# Patient Record
Sex: Female | Born: 1975 | Race: White | Hispanic: No | Marital: Single | Smoking: Never smoker
Health system: Southern US, Community
[De-identification: ages and names within clinical notes are randomized; demographics above are authoritative.]

## PROBLEM LIST (undated history)

## (undated) DIAGNOSIS — E785 Hyperlipidemia, unspecified: Secondary | ICD-10-CM

## (undated) DIAGNOSIS — F419 Anxiety disorder, unspecified: Secondary | ICD-10-CM

## (undated) DIAGNOSIS — K219 Gastro-esophageal reflux disease without esophagitis: Secondary | ICD-10-CM

## (undated) DIAGNOSIS — G4730 Sleep apnea, unspecified: Secondary | ICD-10-CM

## (undated) DIAGNOSIS — D869 Sarcoidosis, unspecified: Secondary | ICD-10-CM

## (undated) DIAGNOSIS — K760 Fatty (change of) liver, not elsewhere classified: Secondary | ICD-10-CM

## (undated) DIAGNOSIS — M1990 Unspecified osteoarthritis, unspecified site: Secondary | ICD-10-CM

## (undated) DIAGNOSIS — F32A Depression, unspecified: Secondary | ICD-10-CM

## (undated) DIAGNOSIS — I10 Essential (primary) hypertension: Secondary | ICD-10-CM

## (undated) HISTORY — DX: Unspecified osteoarthritis, unspecified site: M19.90

## (undated) HISTORY — DX: Sarcoidosis, unspecified: D86.9

## (undated) HISTORY — DX: Essential (primary) hypertension: I10

## (undated) HISTORY — PX: ANTERIOR CRUCIATE LIGAMENT REPAIR: SHX115

## (undated) HISTORY — PX: CHOLECYSTECTOMY: SHX55

## (undated) HISTORY — DX: Gastro-esophageal reflux disease without esophagitis: K21.9

## (undated) HISTORY — DX: Hyperlipidemia, unspecified: E78.5

## (undated) HISTORY — PX: BACK SURGERY: SHX140

## (undated) HISTORY — DX: Fatty (change of) liver, not elsewhere classified: K76.0

## (undated) HISTORY — PX: CERVICAL FUSION: SHX112

## (undated) HISTORY — PX: OTHER SURGICAL HISTORY: SHX169

---

## 2008-06-13 IMAGING — CT CT HEAD WITHOUT CONTRAST
1 series · 16 of 29 positions shown, 20 images · non-contrast
Comparison: none

REASON FOR EXAM: persistent  headache  call report [id number]
COMMENTS:

[Series 2: soft tissue · axial · 0.39mm/px · z∈[+256,+386]mm · 16 of 29 slices shown, 20 images]
[im 2/29  brain]
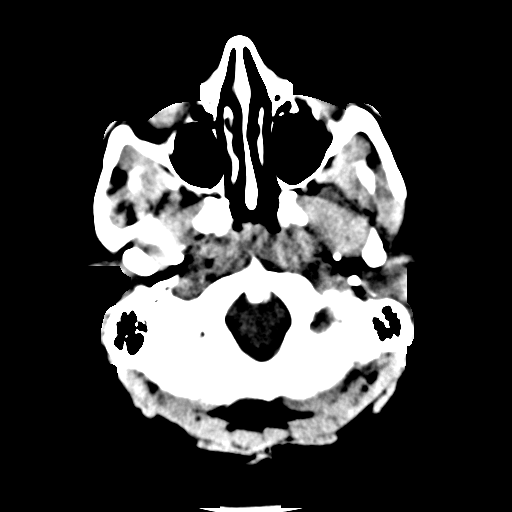
[im 2/29  bone]
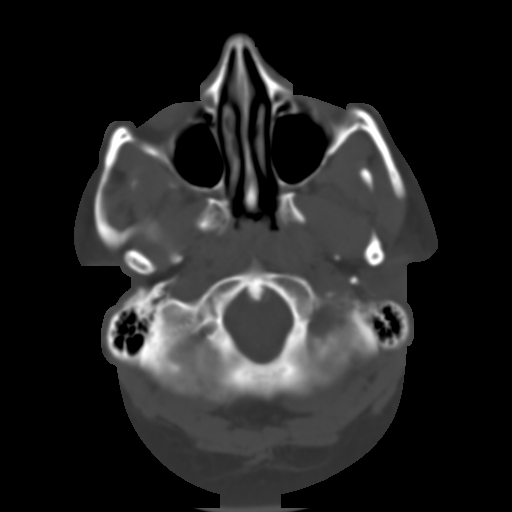
[im 4/29  brain]
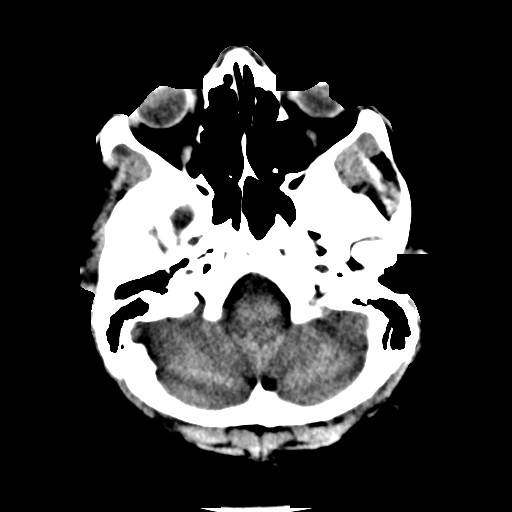
[im 6/29  brain]
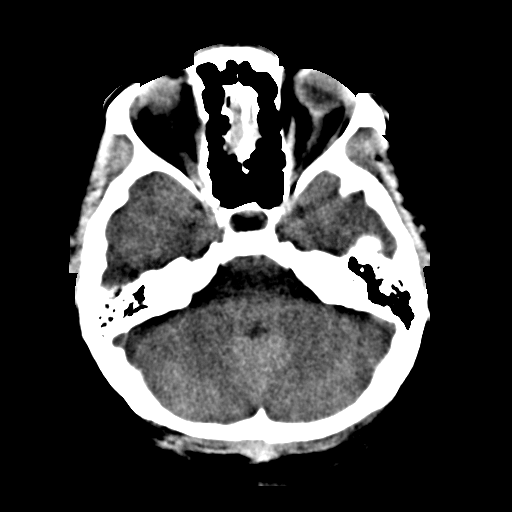
[im 7/29  brain]
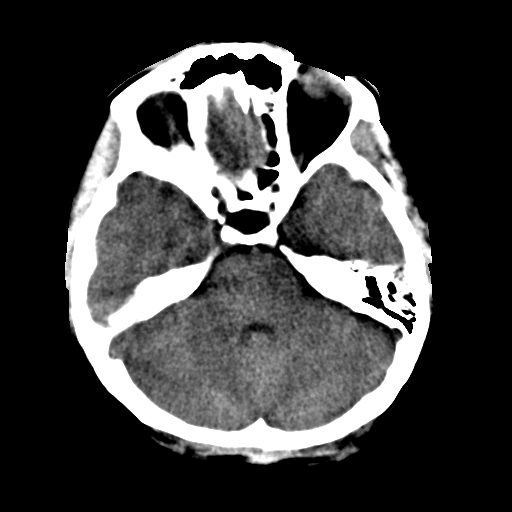
[im 9/29  brain]
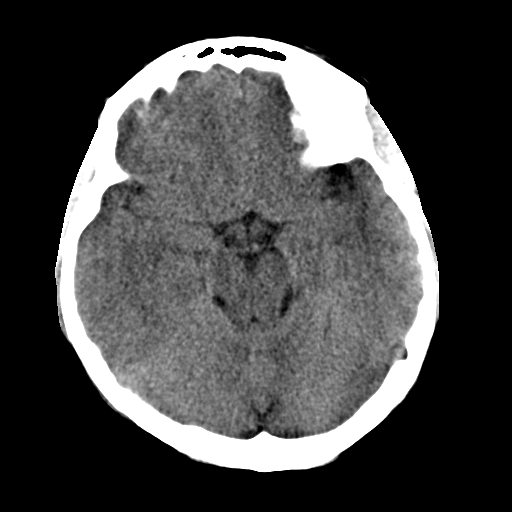
[im 9/29  bone]
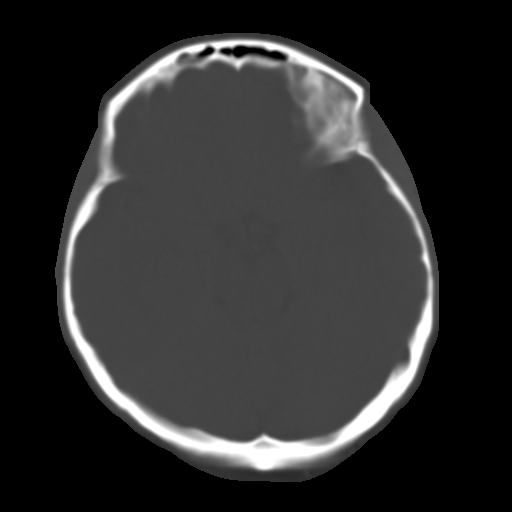
[im 11/29  brain]
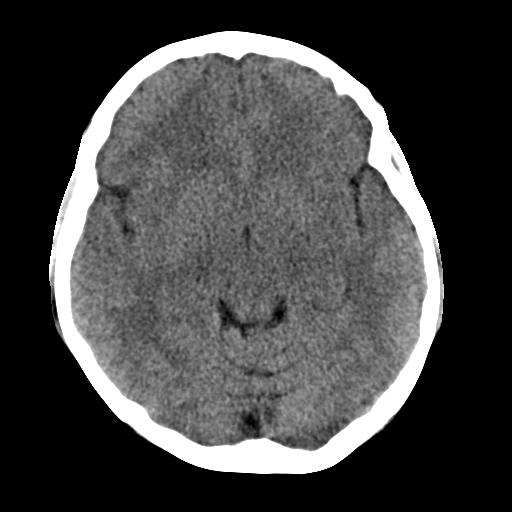
[im 12/29  brain]
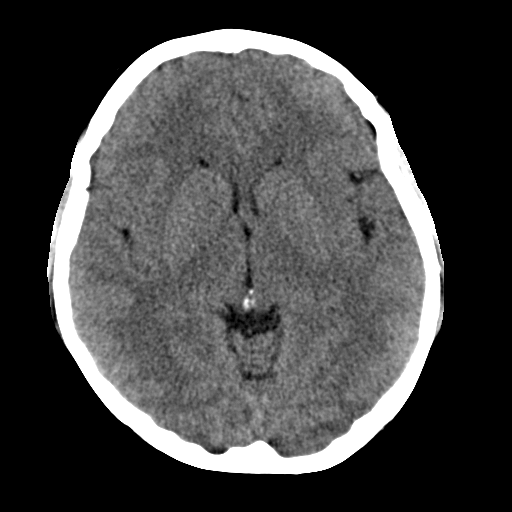
[im 14/29  brain]
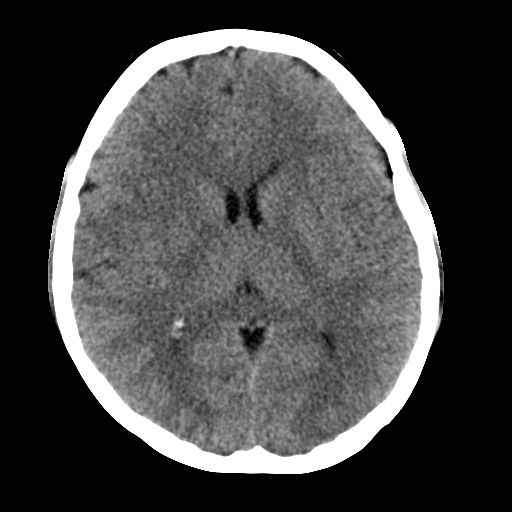
[im 16/29  brain]
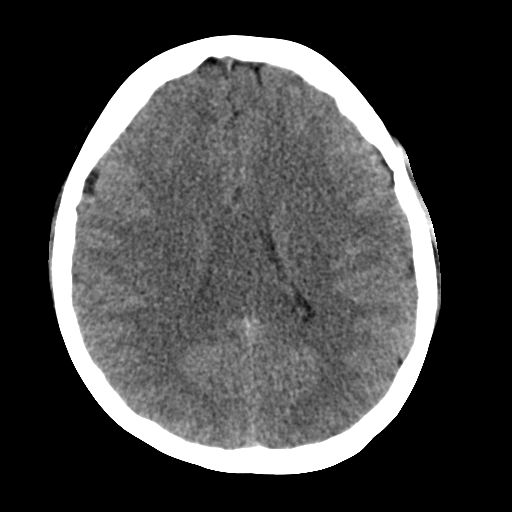
[im 16/29  bone]
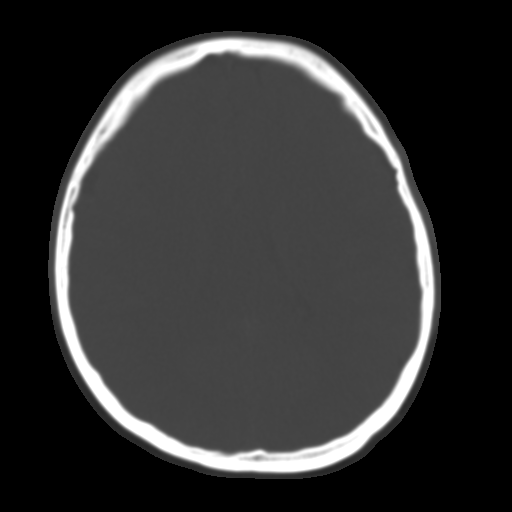
[im 18/29  brain]
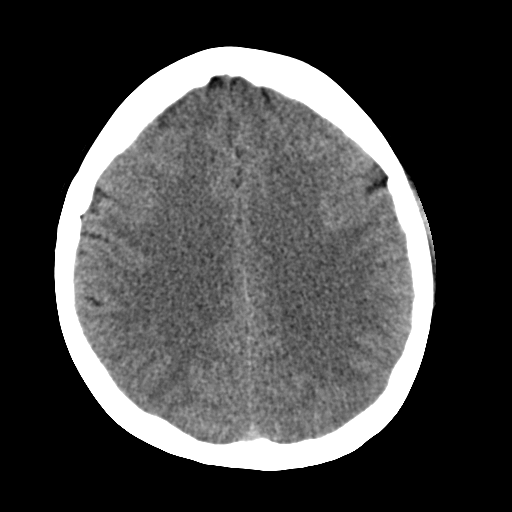
[im 19/29  brain]
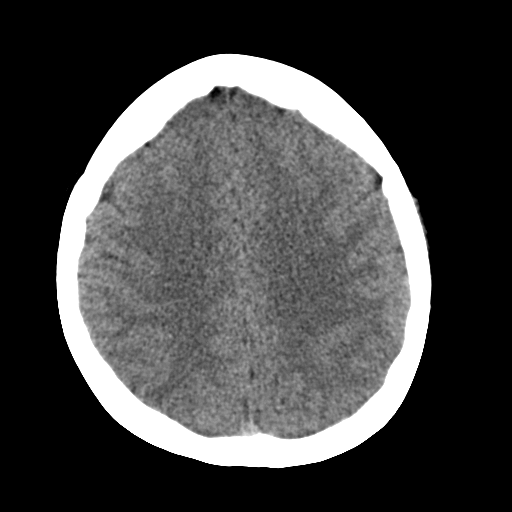
[im 21/29  brain]
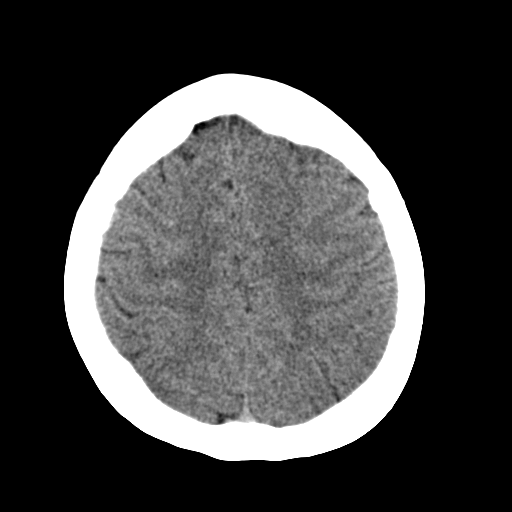
[im 23/29  brain]
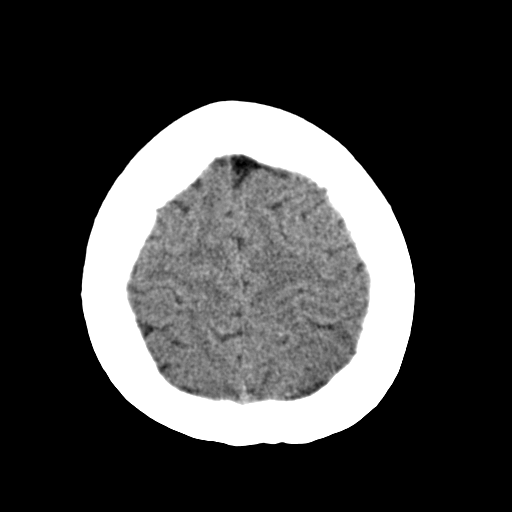
[im 23/29  bone]
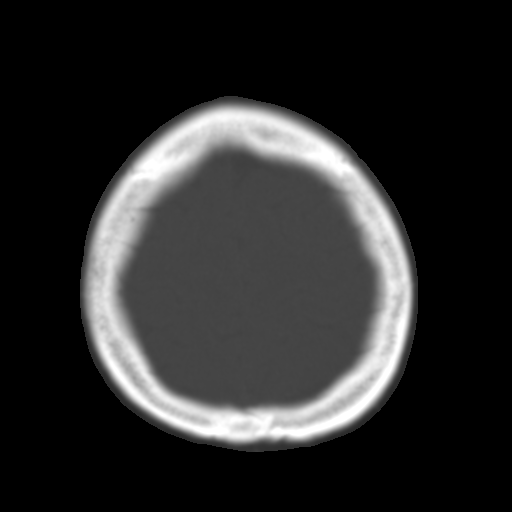
[im 24/29  brain]
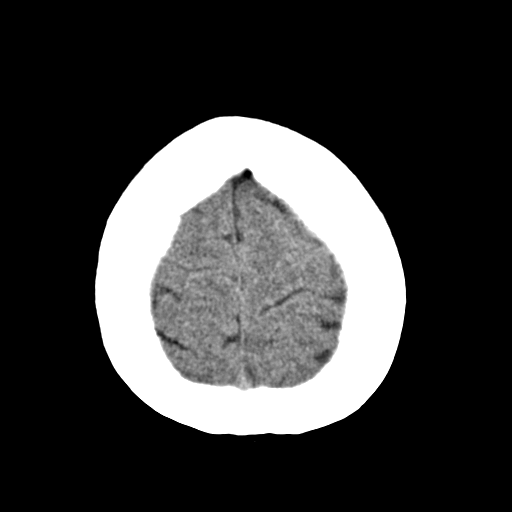
[im 26/29  brain]
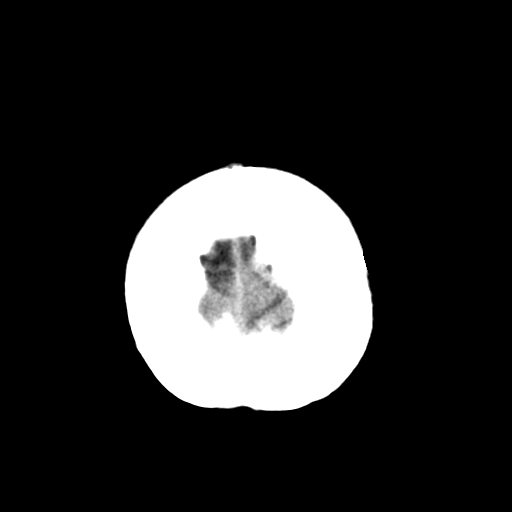
[im 28/29  brain]
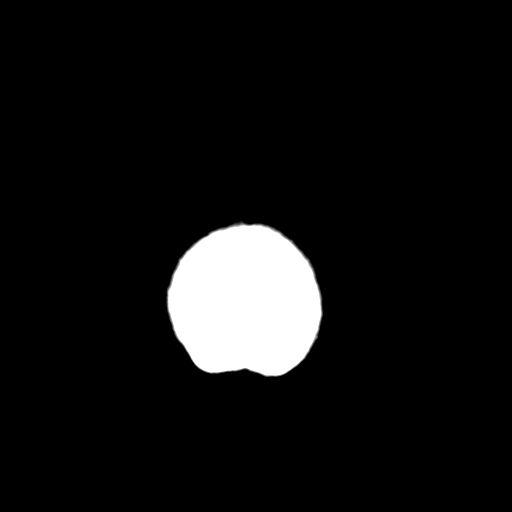

[16 of 29 positions shown; findings below may reference images not displayed]

PROCEDURE:     CT  - CT HEAD WITHOUT CONTRAST  - January 16, 2007 [DATE]

RESULT:     The ventricles are normal in size and position. There is no
intracranial hemorrhage, mass, or mass-effect. At bone window settings, the
observed portions of the paranasal sinuses are clear. No lytic or blastic
bony lesion is seen. The mastoids are well pneumatized.
IMPRESSION: Normal noncontrast CT scan of the brain.

## 2008-06-13 IMAGING — CR LUMBAR PUNCTURE FLUORO GUIDE
2 series · 2 of 2 positions shown · non-contrast
Comparison: none

REASON FOR EXAM: headache/neck stiffness eval for meningitis  Dr Ula Aleksandra
Tiger [id number]
COMMENTS:

PROCEDURE:     FL  - FL  GUIDED LUMBAR PUNCTURE  - January 16, 2007  [DATE]
RESULT:
HISTORY: Evaluate for meningitis, headache, and stiff neck.

[run]
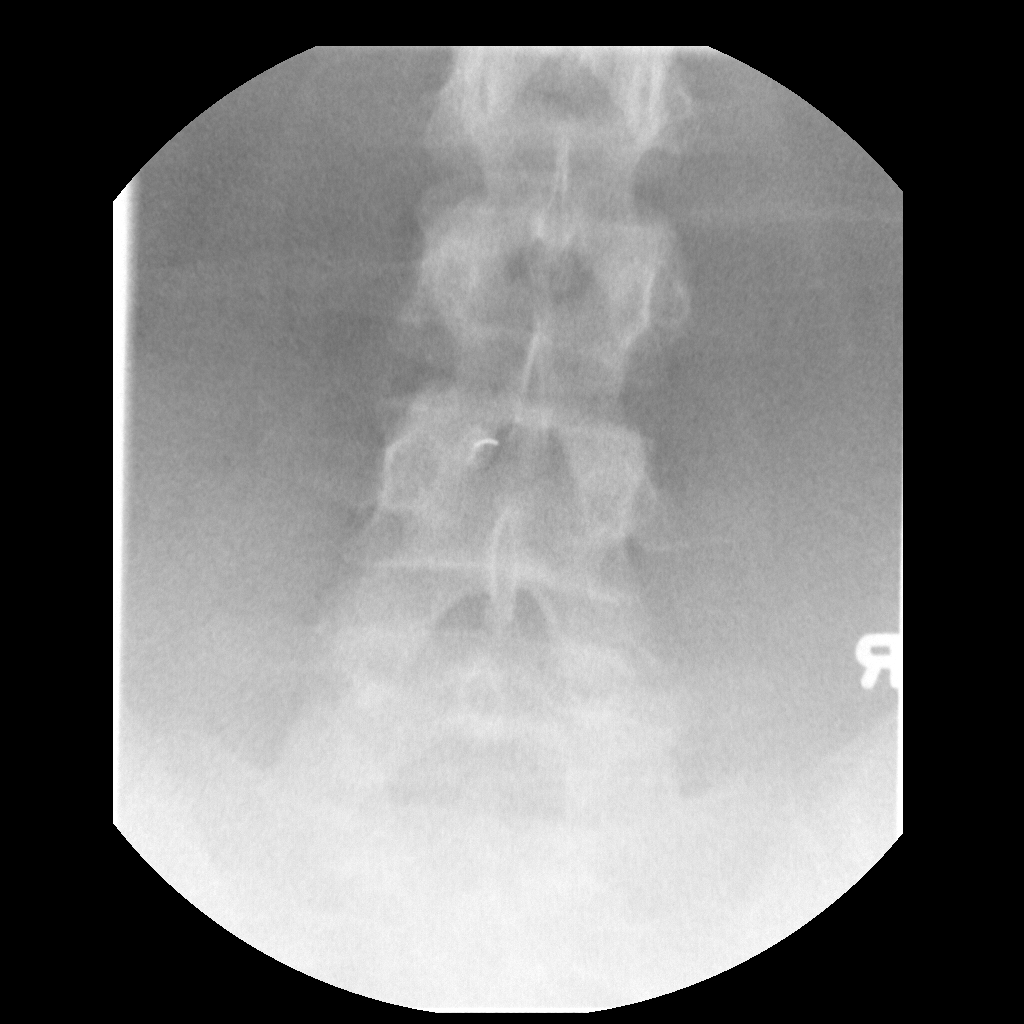

[view not recorded]
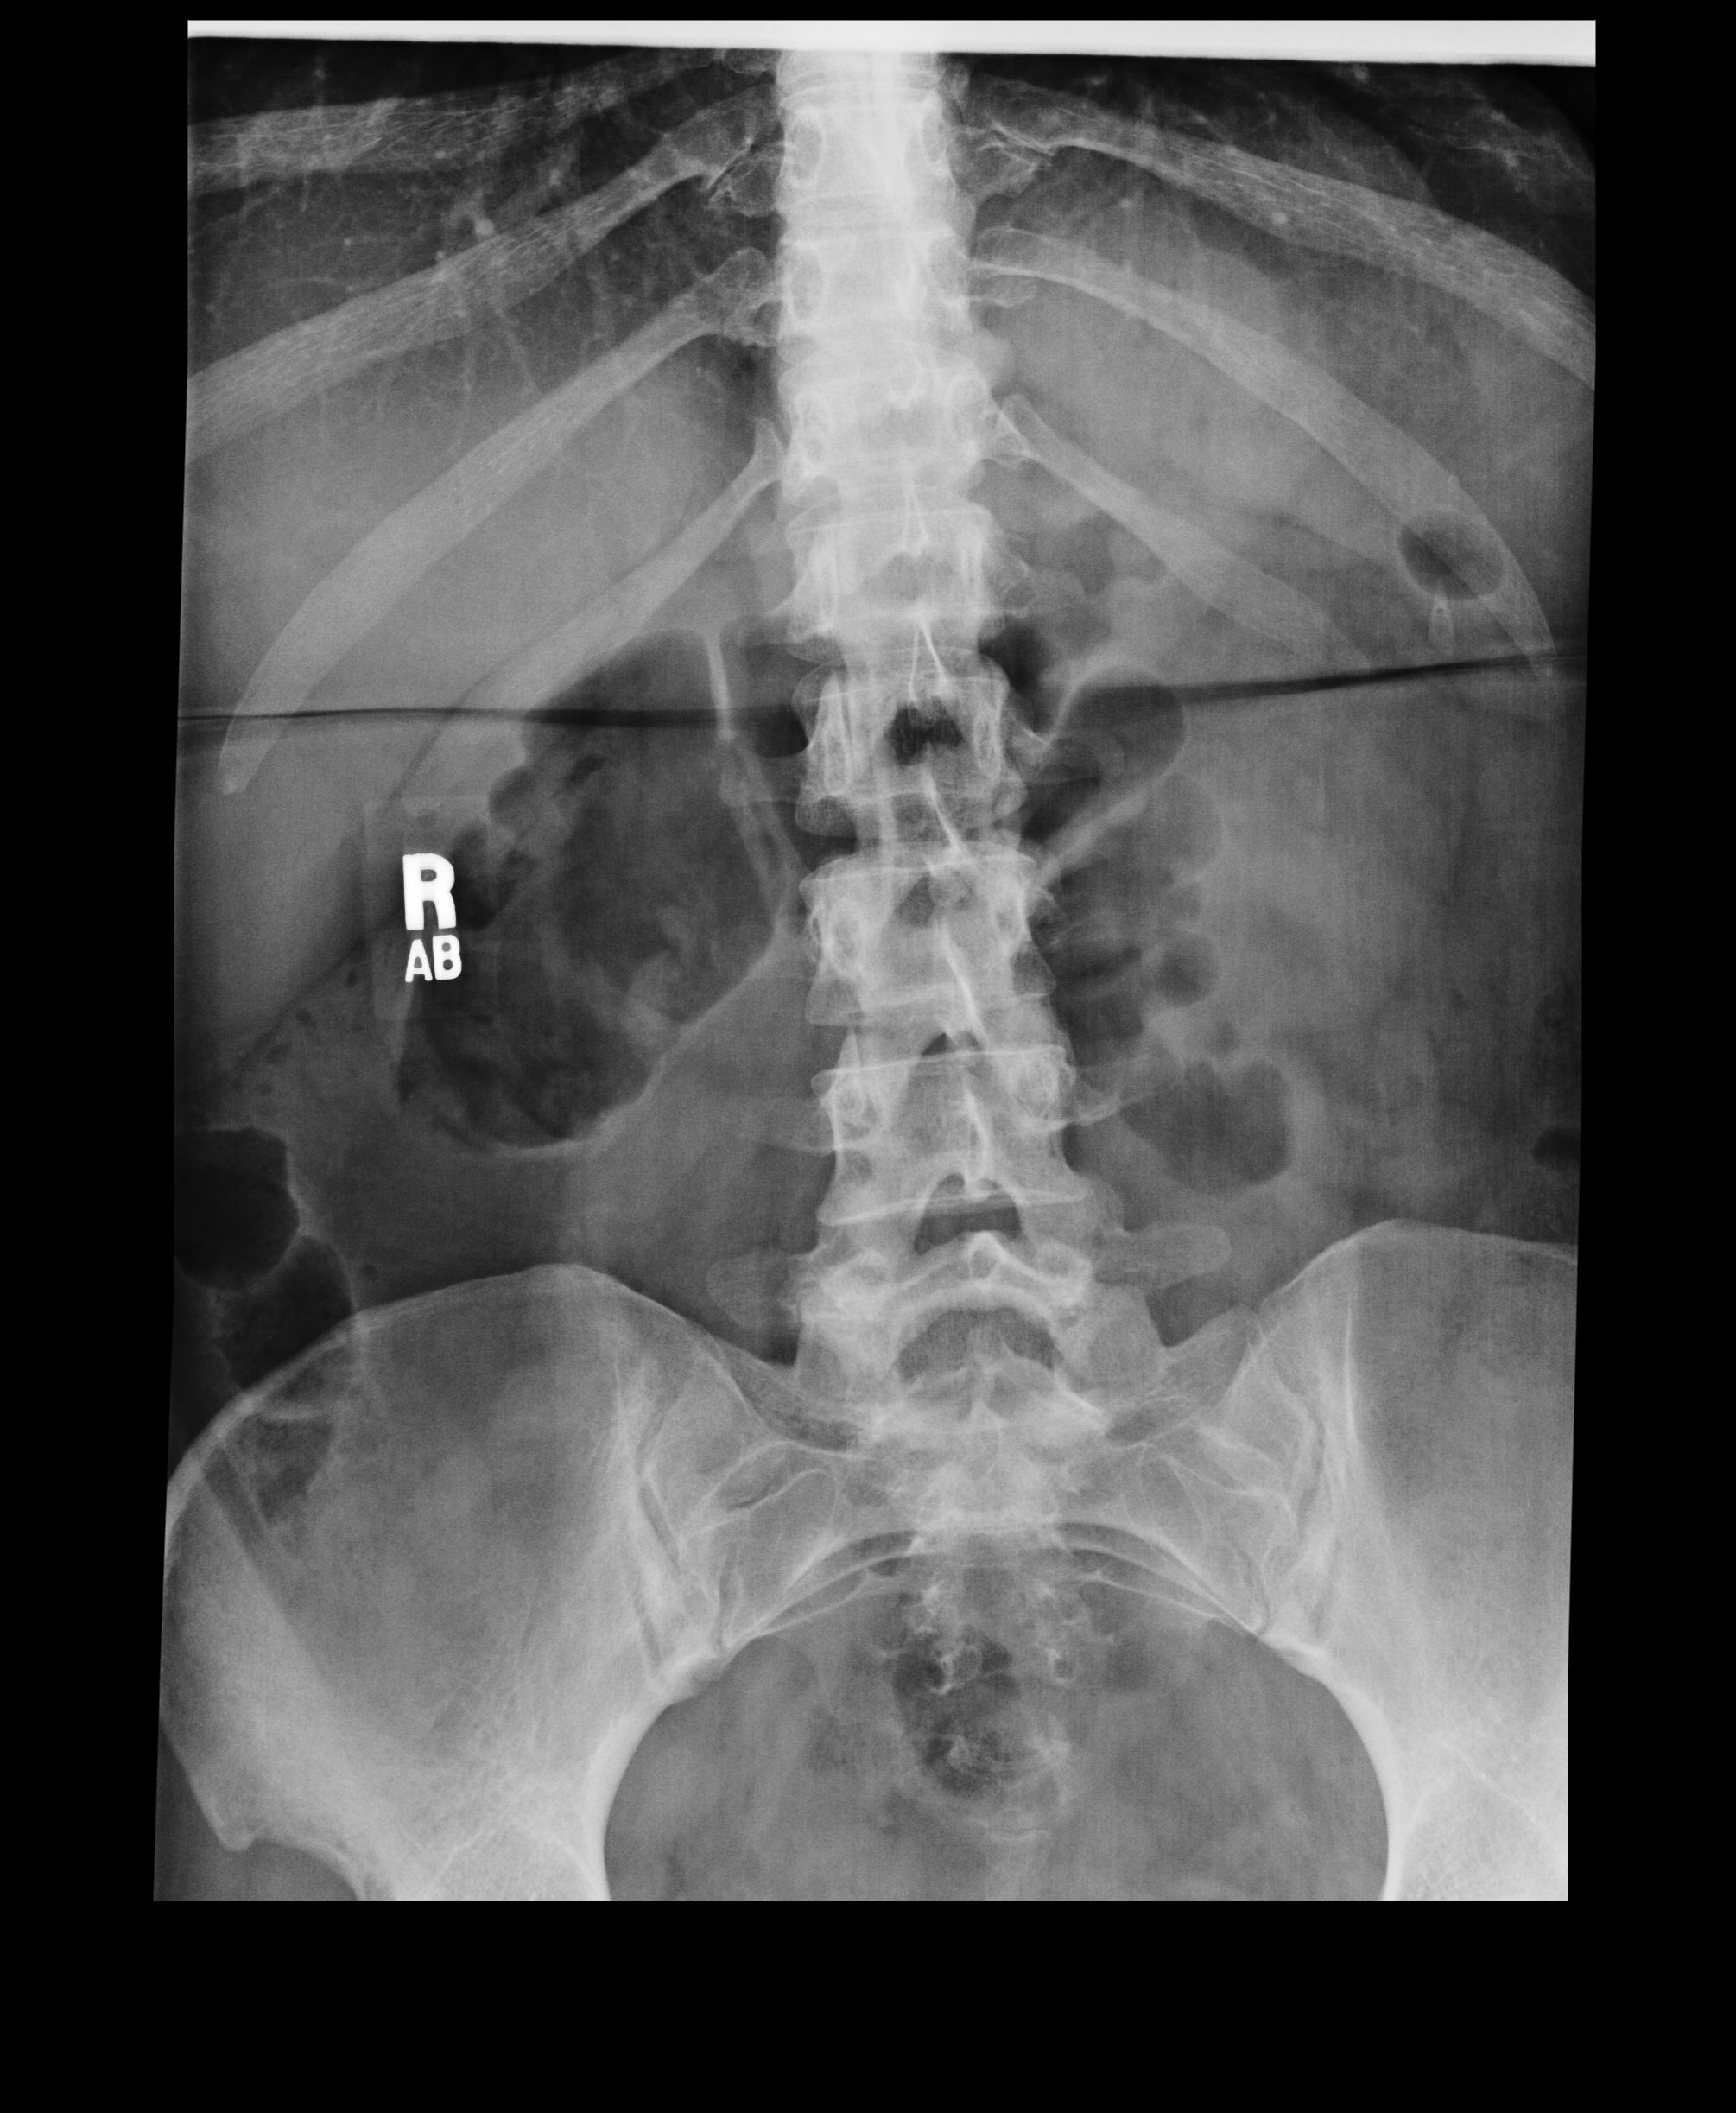

[2 of 2 positions shown; findings below may reference images not displayed]

PROCEDURE AND FINDINGS:  After discussing the risks and benefits of this
procedure with the patient informed consent was obtained.  The back was
sterilely prepped and draped.  A 22-gauge spinal needle was advanced into
the spinal canal and 6 ml of clear CSF obtained.  This was sent in three
separate tubes.
IMPRESSION: Successful fluoro-directed lumbar puncture.

## 2014-02-04 VITALS — BP 138/82 | HR 74 | Resp 16 | Ht 61.0 in | Wt 243.0 lb

## 2014-02-04 DIAGNOSIS — K8020 Calculus of gallbladder without cholecystitis without obstruction: Secondary | ICD-10-CM

## 2014-02-04 NOTE — Patient Instructions (Signed)
Patient to decide when she wants to have gallbladder surgery.   Laparoscopic Cholecystectomy Laparoscopic cholecystectomy is surgery to remove the gallbladder. The gallbladder is located in the upper right part of the abdomen, behind the liver. It is a storage sac for bile produced in the liver. Bile aids in the digestion and absorption of fats. Cholecystectomy is often done for inflammation of the gallbladder (cholecystitis). This condition is usually caused by a buildup of gallstones (cholelithiasis) in your gallbladder. Gallstones can block the flow of bile, resulting in inflammation and pain. In severe cases, emergency surgery may be required. When emergency surgery is not required, you will have time to prepare for the procedure. Laparoscopic surgery is an alternative to open surgery. Laparoscopic surgery has a shorter recovery time. Your common bile duct may also need to be examined during the procedure. If stones are found in the common bile duct, they may be removed. LET [facility] PROVIDER KNOW ABOUT:  Any allergies you have.  All medicines you are taking, including vitamins, herbs, eye drops, creams, and over-the-counter medicines.  Previous problems you or members of your family have had with the use of anesthetics.  Any blood disorders you have.  Previous surgeries you have had.  Medical conditions you have. RISKS AND COMPLICATIONS Generally, this is a safe procedure. However, as with any procedure, complications can occur. Possible complications include:  Infection.  Damage to the common bile duct, nerves, arteries, veins, or other internal organs such as the stomach, liver, or intestines.  Bleeding.  A stone may remain in the common bile duct.  A bile leak from the cyst duct that is clipped when your gallbladder is removed.  The need to convert to open surgery, which requires a larger incision in the abdomen. This may be necessary if your surgeon thinks it is not  safe to continue with a laparoscopic procedure. BEFORE THE PROCEDURE  Ask your health care provider about changing or stopping any regular medicines. You will need to stop taking aspirin or blood thinners at least 5 days prior to surgery.  Do not eat or drink anything after midnight the night before surgery.  Let your health care provider know if you develop a cold or other infectious problem before surgery. PROCEDURE   You will be given medicine to make you sleep through the procedure (general anesthetic). A breathing tube will be placed in your mouth.  When you are asleep, your surgeon will make several small cuts (incisions) in your abdomen.  A thin, lighted tube with a tiny camera on the end (laparoscope) is inserted through one of the small incisions. The camera on the laparoscope sends a picture to a TV screen in the operating room. This gives the surgeon a good view inside your abdomen.  A gas will be pumped into your abdomen. This expands your abdomen so that the surgeon has more room to perform the surgery.  Other tools needed for the procedure are inserted through the other incisions. The gallbladder is removed through one of the incisions.  After the removal of your gallbladder, the incisions will be closed with stitches, staples, or skin glue. AFTER THE PROCEDURE  You will be taken to a recovery area where your progress will be checked often.  You may be allowed to go home the same day if your pain is controlled and you can tolerate liquids. Document Released: 11/01/2005 Document Revised: 08/22/2013 Document Reviewed: 06/13/2013 [facility] Patient Information 2014 [employer] [place].

## 2014-02-04 NOTE — Progress Notes (Signed)
Patient ID: Courtney NegusChristy S Simpson, female   DOB: 10-31-1976, 38 y.o.   MRN: [id number]  Chief Complaint  Patient presents with  . Abdominal Pain    gallstones    HPI Courtney Simpson is a 38 y.o. female here today for a evaluation of gallstones. NP Alvis Lemmingsawn Romeo AppleHarrison is the referring provider. Dr. Daniel NonesBert Klein is her PCP.  [facility]. The patient complains of upper right quadrant pain that has been presents for 1 month. The pain is described as nagging, sharpe, and throbbing pain. The pain is constantly there its worse at times. Greasy and spicy foods aggravate it. She also complains of diarrhea and nausea but has not vomited.   The patient also reports 7-8 loose stools per day. Rare episode of bright red blood.  Review of the GI note suggested a suspicion for delayed gastric emptying. Normal testing in 2007 2009. EGD in 2013 suggested retained food. No weight loss/gain over the last 2 years.     HPI  Past Medical History  Diagnosis Date  . Sarcoidosis   . GERD (gastroesophageal reflux disease)   . Fatty liver   . Arthritis   . Hyperlipidemia     Past Surgical History  Procedure Laterality Date  . Anterior cruciate ligament repair Bilateral   . Back surgery      History reviewed. No pertinent family history.  Social History History  Substance Use Topics  . Smoking status: Never Smoker   . Smokeless tobacco: Never Used  . Alcohol Use: No    Allergies  Allergen Reactions  . Eggs Or Egg-Derived Products Rash    Egg whites  . Other Anaphylaxis    Nuts  . Septra [Sulfamethoxazole-Tmp Ds] Rash  . Sulfa Antibiotics Rash    Current Outpatient Prescriptions  Medication Sig Dispense Refill  . lisinopril (PRINIVIL,ZESTRIL) 20 MG tablet Take 20 mg by mouth daily.      Marland Kitchen. omeprazole (PRILOSEC) 20 MG capsule Take 20 mg by mouth daily.      . sertraline (ZOLOFT) 25 MG tablet Take 25 mg by mouth daily.       No current facility-administered  medications for this visit.    Review of Systems Review of Systems  Constitutional: Negative.   Respiratory: Negative.   Cardiovascular: Negative.   Gastrointestinal: Positive for nausea, abdominal pain and diarrhea.    Blood pressure 138/82, pulse 74, resp. rate 16, height 5\' 1"  (1.549 m), weight 243 lb (110.224 kg).  Physical Exam Physical Exam  Constitutional: She is oriented to person, place, and time. She appears well-developed and well-nourished.  Cardiovascular: Normal rate, regular rhythm and normal heart sounds.   No murmur heard. Pulmonary/Chest: Effort normal and breath sounds normal.  Abdominal: Soft. Normal appearance and bowel sounds are normal. There is no hepatosplenomegaly. There is no tenderness. No hernia.  Neurological: She is alert and oriented to person, place, and time.  Skin: Skin is warm and dry.    Data Reviewed Abdominal ultrasound dated January 16, 2014 suggested hepatic steatosis. Multiple small echogenic foci with layering were identified consistent with gallstones. Gallbladder wall thickness was reported at 1.4 mm. CBD measured 3.3 mm. No evidence of acute cholecystitis.  HIDA scan without CCK showed normal gallbladder function.  Laboratory studies dated January 04 2014 showed normal CBC, normal liver function studies and normal basic metabolic panel. Creatinine 0.8.  Assessment    Gallstones  Morbid obesity  Profound diarrhea, questionable etiology.  Plan    The patient has begun her investigation into bariatric surgery. We reviewed the cholecystectomy would certainly be indicated during the procedure as she has known stones, and the fact that bariatric surgery is lithogenic.  She's not sure if she wants to wait to go through the bariatric program before cholecystectomy, although she might be able to avoid a second anesthetic exposure and separate operative intervention.  The risks and benefits associated with elective cholecystectomy  were discussed. Bile duct injury, bleeding, infection as well as to the standard anesthesia complications from general anesthesia were reviewed.  With her fairly benign in clinical exam today, and her knowledge of trigger includes, she may be best served by ongoing efforts at weight loss, bariatric consultation in a combined procedure. She'll consider her options and notified the office she would like to proceed.   The case was reviewed with Ms. Romeo Apple from the GI department. Potential benefit from pre-cholecystitis colonoscopy was discussed, considering the patient's frequent stools.  Will await the results of further GI evaluation prior to scheduling the patient for cholecystectomy.        Earline Mayotte 02/05/2014, 6:43 AM

## 2014-02-05 DIAGNOSIS — K8020 Calculus of gallbladder without cholecystitis without obstruction: Secondary | ICD-10-CM | POA: Insufficient documentation

## 2014-02-08 NOTE — Telephone Encounter (Signed)
SHE CALLED TO LET YOU KNOW DR OH HAS SCHEDULED PT FOR A COLONOSCOPY & EGD ON Monday 02-18-14 @ [facility]

## 2015-04-29 DIAGNOSIS — R531 Weakness: Secondary | ICD-10-CM

## 2015-05-08 DIAGNOSIS — M6289 Other specified disorders of muscle: Secondary | ICD-10-CM | POA: Diagnosis not present

## 2015-05-08 DIAGNOSIS — R531 Weakness: Secondary | ICD-10-CM

## 2015-06-18 DIAGNOSIS — M5412 Radiculopathy, cervical region: Secondary | ICD-10-CM

## 2015-07-11 DIAGNOSIS — M5412 Radiculopathy, cervical region: Secondary | ICD-10-CM | POA: Insufficient documentation

## 2015-07-11 DIAGNOSIS — Z981 Arthrodesis status: Secondary | ICD-10-CM | POA: Insufficient documentation

## 2015-07-15 DIAGNOSIS — M5412 Radiculopathy, cervical region: Secondary | ICD-10-CM

## 2015-11-20 VITALS — BP 116/82 | HR 81 | Ht 61.0 in | Wt 219.7 lb

## 2015-11-20 DIAGNOSIS — R102 Pelvic and perineal pain: Secondary | ICD-10-CM | POA: Diagnosis not present

## 2015-11-20 DIAGNOSIS — N921 Excessive and frequent menstruation with irregular cycle: Secondary | ICD-10-CM

## 2015-11-20 DIAGNOSIS — Z975 Presence of (intrauterine) contraceptive device: Secondary | ICD-10-CM

## 2015-11-20 DIAGNOSIS — F329 Major depressive disorder, single episode, unspecified: Secondary | ICD-10-CM | POA: Diagnosis not present

## 2015-11-20 DIAGNOSIS — F32A Depression, unspecified: Secondary | ICD-10-CM

## 2015-11-20 NOTE — Progress Notes (Signed)
Subjective:     Patient ID: Courtney Simpson, female   DOB: Jan 01, 1976, 40 y.o.   MRN: [id number]  HPI Reports daily spotting with Mirena over last few months and persistant LLQ pains- sharp shooting pains, H/O ovarian cyst, also reports worsening depression  Review of Systems See above Has been on zoloft for 2 years and increased dose to 200mg  a few months ago, with no improvement. Please depression screening in chart, scored 24, and family is very concerned. Reports main trigger was spouses porn use 6 months ago, and feels like she can't past it. Isn't sleeping more than an hour a night, tearful daily, hopeless, has been talking to mom and close friend, but doesn't feel like she can discuss it with others. No appetite and has had 40 # weight loss. Denies suicidal ideation but does states she wishes she was dead.    Objective:   Physical Exam A&O x4 Filed Vitals:   11/20/15 1357  Height: 5\' 1"  (1.549 m)  Weight: 219 lb 11.2 oz (99.655 kg)    well groomed female, depressed affect, tearful,  Depression screen PHQ 2/9 11/20/2015  Decreased Interest 3  Down, Depressed, Hopeless 3  PHQ - 2 Score 6  Altered sleeping 3  Tired, decreased energy 3  Change in appetite 3  Feeling bad or failure about yourself  3  Trouble concentrating 2  Moving slowly or fidgety/restless 2  Suicidal thoughts 2  PHQ-9 Score 24  Difficult doing work/chores Somewhat difficult  pelvic deferred     Assessment:     BTB with IUD LLQ pain  Severe depression on SSRI     Plan:     Labs obtained Pelvic u/s ordered Referral for counseling with Erlene SentersKatherine Wagoner placed- patient consents. Medications changed to Prozac 20mg  daily x 1 week and zoloft decreased to 100mg  x 1 week, then increase prozac to 40mg  daily and decrease zoloft to 50mg  x 1 week, then stop.  Also trazadone nightly x 1 month then prn  Patient is to tell spouse and mother about med changes and s/s of worsening depression.  RTC 2  weeks.  Melody [place], CNM

## 2015-11-21 DIAGNOSIS — R102 Pelvic and perineal pain: Secondary | ICD-10-CM

## 2015-11-25 DIAGNOSIS — R102 Pelvic and perineal pain: Secondary | ICD-10-CM

## 2015-11-27 DIAGNOSIS — E559 Vitamin D deficiency, unspecified: Secondary | ICD-10-CM

## 2015-11-28 NOTE — Telephone Encounter (Signed)
Pt called she states her medication isnt working and she is having more lows than highs, and she is feeling very jittery  I spoke with Melody and she suggest pt voluntary commits herself, pt states she was eating lunch and she will think about it and call us back

## 2015-11-28 NOTE — Telephone Encounter (Signed)
Notified pt of results and gave her pamphlet on Vit D

## 2015-11-28 NOTE — Telephone Encounter (Signed)
-----   Message from Purcell NailsMelody N Shambley, [place]M sent at 11/27/2015  5:40 PM EST ----- Please mail info on vit D def.

## 2015-12-01 NOTE — Telephone Encounter (Signed)
Pt called in 11/28/15, she tells me that she feels more depressed, having more lows than highs, she states she is very jittery, she also stated" im not in a good place", I made sure pt was with someone and she verified she was with a friend.  I explained to pt that I would need to talk with Melody when she came out of a room, pt voiced understanding.  Per MNS she advised me to contact pt Melody felt like pt needed to voluntary commit herself, pt didn't want to do that, I again explained to her how she was in the office on 11/21/15 very depressed, she has been on her medication since that time and if she felt like it wasn't working that was the best place for her.  She voiced understanding and told me she would be spending some time with her mother over the weekend and she was going to try to beat this.  I advised pt to not be by herself over the weekend, stay with positive people and if anything changed in her situation to please present to ED, she voiced understanding and thanked me for my time

## 2015-12-03 VITALS — BP 95/60 | HR 74 | Wt 220.6 lb

## 2015-12-03 DIAGNOSIS — F329 Major depressive disorder, single episode, unspecified: Secondary | ICD-10-CM | POA: Diagnosis not present

## 2015-12-03 DIAGNOSIS — F32A Depression, unspecified: Secondary | ICD-10-CM

## 2015-12-03 NOTE — Progress Notes (Signed)
Patient ID: Courtney Simpson, female   DOB: Feb 13, 1976, 40 y.o.   MRN: [id number]   Here for medication follow-up (see previous visit)  States she is feeling a lot better this week, starting to feel more like herself. Has been sleeping well most nights.  A: Depression with sleep disturbance, improving with new medications  P: continue Prozac at  daily, and after 2 more weeks 1/2 zoloft x 2 weeks then stop. Continue Trazodone nightly.  To send message after 3-4 weeks and let me know how she is feeling.  Melody [place], CNM

## 2015-12-26 DIAGNOSIS — R197 Diarrhea, unspecified: Secondary | ICD-10-CM | POA: Diagnosis not present

## 2015-12-26 DIAGNOSIS — R630 Anorexia: Secondary | ICD-10-CM | POA: Diagnosis not present

## 2015-12-26 DIAGNOSIS — R112 Nausea with vomiting, unspecified: Secondary | ICD-10-CM | POA: Diagnosis not present

## 2015-12-26 DIAGNOSIS — I10 Essential (primary) hypertension: Secondary | ICD-10-CM | POA: Diagnosis not present

## 2015-12-26 DIAGNOSIS — R1013 Epigastric pain: Secondary | ICD-10-CM | POA: Insufficient documentation

## 2015-12-26 DIAGNOSIS — R1010 Upper abdominal pain, unspecified: Secondary | ICD-10-CM | POA: Diagnosis present

## 2015-12-26 MED ORDER — OXYCODONE-ACETAMINOPHEN 5-325 MG PO TABS
2.0000 | ORAL_TABLET | Freq: Once | ORAL | Status: AC
Start: 2015-12-26 — End: 2015-12-26
  Administered 2015-12-26: 2 via ORAL

## 2015-12-26 MED ADMIN — Ondansetron Orally Disintegrating Tab 4 MG: 4 mg | ORAL

## 2015-12-26 MED ADMIN — Sodium Chloride IV Soln 0.9%: INTRAVENOUS | @ 18:00:00 | NDC 00338004904

## 2015-12-26 NOTE — ED Notes (Signed)
Pt with a negative pregnancy test at her PCP this am  Paperwork is on her chart  - MD Mayford Knife verified

## 2015-12-26 NOTE — ED Notes (Signed)
Pt complains of upper right abdominal pain for 1 week. Pt states she saw Dr. Graciela Husbands today and had XR which was negative. Dr Graciela Husbands sent pt for follow up CT. Pt describes pain as crushing is radiates around to back. Pt complains of decreased appetite, nausea and diarrhea.

## 2015-12-26 NOTE — Discharge Instructions (Signed)
Abdominal Pain, Adult °Many things can cause abdominal pain. Usually, abdominal pain is not caused by a disease and will improve without treatment. It can often be observed and treated at home. Your health care provider will do a physical exam and possibly order blood tests and X-rays to help determine the seriousness of your pain. However, in many cases, more time must pass before a clear cause of the pain can be found. Before that point, your health care provider may not know if you need more testing or further treatment. °HOME CARE INSTRUCTIONS °Monitor your abdominal pain for any changes. The following actions may help to alleviate any discomfort you are experiencing: °· Only take over-the-counter or prescription medicines as directed by your health care provider. °· Do not take laxatives unless directed to do so by your health care provider. °· Try a clear liquid diet (broth, tea, or water) as directed by your health care provider. Slowly move to a bland diet as tolerated. °SEEK MEDICAL CARE IF: °· You have unexplained abdominal pain. °· You have abdominal pain associated with nausea or diarrhea. °· You have pain when you urinate or have a bowel movement. °· You experience abdominal pain that wakes you in the night. °· You have abdominal pain that is worsened or improved by eating food. °· You have abdominal pain that is worsened with eating fatty foods. °· You have a fever. °SEEK IMMEDIATE MEDICAL CARE IF: °· Your pain does not go away within 2 hours. °· You keep throwing up (vomiting). °· Your pain is felt only in portions of the abdomen, such as the right side or the left lower portion of the abdomen. °· You pass bloody or black tarry stools. °MAKE SURE YOU: °· Understand these instructions. °· Will watch your condition. °· Will get help right away if you are not doing well or get worse. °  °This information is not intended to replace advice given to you by your health care provider. Make sure you discuss  any questions you have with your health care provider. °  °Document Released: 08/11/2005 Document Revised: 07/23/2015 Document Reviewed: 07/11/2013 °Elsevier Interactive Patient Education ©2016 [employer] Inc. ° °Diarrhea °Diarrhea is frequent loose and watery bowel movements. It can cause you to feel weak and dehydrated. Dehydration can cause you to become tired and thirsty, have a dry mouth, and have decreased urination that often is dark yellow. Diarrhea is a sign of another problem, most often an infection that will not last long. In most cases, diarrhea typically lasts 2-3 days. However, it can last longer if it is a sign of something more serious. It is important to treat your diarrhea as directed by your caregiver to lessen or prevent future episodes of diarrhea. °CAUSES  °Some common causes include: °· Gastrointestinal infections caused by viruses, bacteria, or parasites. °· Food poisoning or food allergies. °· Certain medicines, such as antibiotics, chemotherapy, and laxatives. °· Artificial sweeteners and fructose. °· Digestive disorders. °HOME CARE INSTRUCTIONS °· Ensure adequate fluid intake (hydration): Have 1 cup (8 oz) of fluid for each diarrhea episode. Avoid fluids that contain simple sugars or sports drinks, fruit juices, whole milk products, and sodas. Your urine should be clear or pale yellow if you are drinking enough fluids. Hydrate with an oral rehydration solution that you can purchase at pharmacies, retail stores, and online. You can prepare an oral rehydration solution at home by mixing the following ingredients together: °¨  - tsp table salt. °¨ ¾ tsp baking soda. °¨    tsp salt substitute containing potassium chloride. °¨ 1  tablespoons sugar. °¨ 1 L (34 oz) of water. °· Certain foods and beverages may increase the speed at which food moves through the gastrointestinal (GI) tract. These foods and beverages should be avoided and include: °¨ Caffeinated and alcoholic beverages. °¨ High-fiber  foods, such as raw fruits and vegetables, nuts, seeds, and whole grain breads and cereals. °¨ Foods and beverages sweetened with sugar alcohols, such as xylitol, sorbitol, and mannitol. °· Some foods may be well tolerated and may help thicken stool including: °¨ Starchy foods, such as rice, toast, pasta, low-sugar cereal, oatmeal, grits, baked potatoes, crackers, and bagels. °¨ Bananas. °¨ Applesauce. °· Add probiotic-rich foods to help increase healthy bacteria in the GI tract, such as yogurt and fermented milk products. °· Wash your hands well after each diarrhea episode. °· Only take over-the-counter or prescription medicines as directed by your caregiver. °· Take a warm bath to relieve any burning or pain from frequent diarrhea episodes. °SEEK IMMEDIATE MEDICAL CARE IF:  °· You are unable to keep fluids down. °· You have persistent vomiting. °· You have blood in your stool, or your stools are black and tarry. °· You do not urinate in 6-8 hours, or there is only a small amount of very dark urine. °· You have abdominal pain that increases or localizes. °· You have weakness, dizziness, confusion, or light-headedness. °· You have a severe headache. °· Your diarrhea gets worse or does not get better. °· You have a fever or persistent symptoms for more than 2-3 days. °· You have a fever and your symptoms suddenly get worse. °MAKE SURE YOU:  °· Understand these instructions. °· Will watch your condition. °· Will get help right away if you are not doing well or get worse. °  °This information is not intended to replace advice given to you by your health care provider. Make sure you discuss any questions you have with your health care provider. °  °Document Released: 10/22/2002 Document Revised: 11/22/2014 Document Reviewed: 07/09/2012 °Elsevier Interactive Patient Education ©2016 Elsevier Inc. ° °

## 2016-02-23 DIAGNOSIS — R1013 Epigastric pain: Secondary | ICD-10-CM | POA: Insufficient documentation

## 2016-02-23 DIAGNOSIS — I10 Essential (primary) hypertension: Secondary | ICD-10-CM | POA: Insufficient documentation

## 2016-02-23 DIAGNOSIS — K648 Other hemorrhoids: Secondary | ICD-10-CM | POA: Insufficient documentation

## 2016-02-23 DIAGNOSIS — Z6841 Body Mass Index (BMI) 40.0 and over, adult: Secondary | ICD-10-CM | POA: Insufficient documentation

## 2016-02-23 DIAGNOSIS — K921 Melena: Secondary | ICD-10-CM | POA: Diagnosis not present

## 2016-02-23 DIAGNOSIS — E785 Hyperlipidemia, unspecified: Secondary | ICD-10-CM | POA: Diagnosis not present

## 2016-02-23 DIAGNOSIS — K5730 Diverticulosis of large intestine without perforation or abscess without bleeding: Secondary | ICD-10-CM | POA: Diagnosis not present

## 2016-02-23 DIAGNOSIS — M1991 Primary osteoarthritis, unspecified site: Secondary | ICD-10-CM | POA: Diagnosis not present

## 2016-02-23 DIAGNOSIS — K2950 Unspecified chronic gastritis without bleeding: Secondary | ICD-10-CM | POA: Insufficient documentation

## 2016-02-23 DIAGNOSIS — D869 Sarcoidosis, unspecified: Secondary | ICD-10-CM | POA: Diagnosis not present

## 2016-02-23 DIAGNOSIS — K317 Polyp of stomach and duodenum: Secondary | ICD-10-CM | POA: Insufficient documentation

## 2016-02-23 DIAGNOSIS — R634 Abnormal weight loss: Secondary | ICD-10-CM | POA: Insufficient documentation

## 2016-02-23 DIAGNOSIS — R197 Diarrhea, unspecified: Secondary | ICD-10-CM | POA: Diagnosis not present

## 2016-02-23 DIAGNOSIS — Z79899 Other long term (current) drug therapy: Secondary | ICD-10-CM | POA: Diagnosis not present

## 2016-02-23 DIAGNOSIS — R1011 Right upper quadrant pain: Secondary | ICD-10-CM | POA: Insufficient documentation

## 2016-02-23 DIAGNOSIS — K219 Gastro-esophageal reflux disease without esophagitis: Secondary | ICD-10-CM | POA: Diagnosis not present

## 2016-02-23 HISTORY — PX: COLONOSCOPY WITH PROPOFOL: SHX5780

## 2016-02-23 HISTORY — PX: ESOPHAGOGASTRODUODENOSCOPY (EGD) WITH PROPOFOL: SHX5813

## 2016-02-23 MED ADMIN — Sodium Chloride IV Soln 0.9%: INTRAVENOUS | @ 10:00:00 | NDC 00338004904

## 2016-02-23 MED ADMIN — Sodium Chloride IV Soln 0.9%: INTRAVENOUS | @ 09:00:00 | NDC 00338004904

## 2016-02-23 NOTE — Anesthesia Postprocedure Evaluation (Signed)
Anesthesia Post Note  Patient: Courtney NegusChristy S Simpson  Procedure(s) Performed: Procedure(s) (LRB): COLONOSCOPY WITH PROPOFOL (N/A) ESOPHAGOGASTRODUODENOSCOPY (EGD) WITH PROPOFOL (N/A)  Patient location during evaluation: Endoscopy Anesthesia Type: General Level of consciousness: awake Pain management: pain level controlled Vital Signs Assessment: post-procedure vital signs reviewed and stable Respiratory status: spontaneous breathing Cardiovascular status: blood pressure returned to baseline Postop Assessment: no headache Anesthetic complications: no    Last Vitals:  Filed Vitals:   02/23/16 1050 02/23/16 1052  BP:  128/74  Pulse: 73 74  Temp:    Resp: 20 20    Last Pain: There were no vitals filed for this visit.               Casmer Yepiz M

## 2016-02-23 NOTE — Anesthesia Preprocedure Evaluation (Signed)
Anesthesia Evaluation  Patient identified by MRN, date of birth, ID band Patient awake    Reviewed: Allergy & Precautions, NPO status , Patient's Chart, lab work & pertinent test results  Airway Mallampati: I  TM Distance: >3 FB Neck ROM: Full    Dental  (+) Teeth Intact   Pulmonary    Pulmonary exam normal        Cardiovascular Exercise Tolerance: Good hypertension, Pt. on medications Normal cardiovascular exam     Neuro/Psych    GI/Hepatic GERD  Medicated and Controlled,Dysphagia--was dilated in the past.   Endo/Other  Morbid obesity  Renal/GU      Musculoskeletal   Abdominal (+) + obese,   Peds  Hematology   Anesthesia Other Findings   Reproductive/Obstetrics                             Anesthesia Physical Anesthesia Plan  ASA: III  Anesthesia Plan: General   Post-op Pain Management:    Induction: Intravenous  Airway Management Planned: Nasal Cannula  Additional Equipment:   Intra-op Plan:   Post-operative Plan:   Informed Consent: I have reviewed the patients History and Physical, chart, labs and discussed the procedure including the risks, benefits and alternatives for the proposed anesthesia with the patient or authorized representative who has indicated his/her understanding and acceptance.     Plan Discussed with: CRNA  Anesthesia Plan Comments:         Anesthesia Quick Evaluation

## 2016-02-23 NOTE — Op Note (Signed)
[facility] Gastroenterology Patient Name: Everlene OtherChristy Lipke Procedure Date: 02/23/2016 9:36 AM MRN: [id number] Account #: [id number] Date of Birth: 1975/12/02 Admit Type: Outpatient Age: [id number] [facility] 3 Gender: Female Note Status: Finalized Procedure:            Colonoscopy Indications:          Epigastric abdominal pain, Chronic diarrhea Providers:            Christena DeemMartin U. Skulskie, MD Referring MD:         Daniel NonesBert Klein, MD (Referring MD) Medicines:            Monitored Anesthesia Care Complications:        No immediate complications. Procedure:            Pre-Anesthesia Assessment:                       - ASA Grade Assessment: III - A patient with severe                        systemic disease.                       After obtaining informed consent, the colonoscope was                        passed under direct vision. Throughout the procedure,                        the patient's blood pressure, pulse, and oxygen                        saturations were monitored continuously. The                        Colonoscope was introduced through the anus and                        advanced to the the cecum, identified by appendiceal                        orifice and ileocecal valve. The colonoscopy was                        performed without difficulty. The patient tolerated the                        procedure well. The quality of the bowel preparation                        was fair. Findings:      Multiple small-mouthed diverticula were found in the sigmoid colon,       descending colon and transverse colon.      Non-bleeding internal hemorrhoids were found during retroflexion and       during anoscopy. The hemorrhoids were small and Grade I (internal       hemorrhoids that do not prolapse).      Biopsies for histology were taken with a cold forceps from the right       colon and left colon for evaluation of microscopic colitis.      The digital rectal exam  was normal. Impression:           -  Preparation of the colon was fair.                       - Diverticulosis in the sigmoid colon, in the                        descending colon and in the transverse colon.                       - Non-bleeding internal hemorrhoids.                       - Biopsies were taken with a cold forceps from the                        right colon and left colon for evaluation of                        microscopic colitis. Recommendation:       - Discharge patient to home. Procedure Code(s):    --- Professional ---                       [phone], Colonoscopy, flexible; with biopsy, single or                        multiple Diagnosis Code(s):    --- Professional ---                       K64.0, First degree hemorrhoids                       R10.13, Epigastric pain                       K52.9, Noninfective gastroenteritis and colitis,                        unspecified                       K57.30, Diverticulosis of large intestine without                        perforation or abscess without bleeding CPT copyright 2016 American Medical Association. All rights reserved. The codes documented in this report are preliminary and upon coder review may  be revised to meet current compliance requirements. Christena Deem, MD 02/23/2016 10:22:19 AM This report has been signed electronically. Number of Addenda: 0 Note Initiated On: 02/23/2016 9:36 AM Scope Withdrawal Time: 0 hours 9 minutes 39 seconds  Total Procedure Duration: 0 hours 15 minutes 27 seconds       [facility]

## 2016-02-23 NOTE — Op Note (Signed)
[facility] Gastroenterology Patient Name: Kandas Oliveto Procedure Date: 02/23/2016 9:37 AM MRN: [id number] Account #: [id number] Date of Birth: [birth year] Admit Type: Outpatient Age: 40 Room: [facility] ENDO ROOM 3 Gender: Female Note Status: Finalized Procedure:            Upper GI endoscopy Indications:          Epigastric abdominal pain, Abdominal pain in the right                        upper quadrant, Melena, Diarrhea, Weight loss Providers:            Christena Deem, MD Referring MD:         Daniel Nones, MD (Referring MD) Medicines:            Monitored Anesthesia Care Complications:        No immediate complications. Procedure:            Pre-Anesthesia Assessment:                       - ASA Grade Assessment: III - A patient with severe                        systemic disease.                       After obtaining informed consent, the endoscope was                        passed under direct vision. Throughout the procedure,                        the patient's blood pressure, pulse, and oxygen                        saturations were monitored continuously. The Endoscope                        was introduced through the mouth, and advanced to the                        third part of duodenum. The patient tolerated the                        procedure well. Findings:      The Z-line was variable. Biopsies were taken with a cold forceps for       histology.      The exam of the esophagus was otherwise normal.      Patchy mild inflammation characterized by congestion (edema), erosions       and erythema was found in the cardia and in the gastric body. Biopsies       were taken with a cold forceps for histology. Biopsies were taken with a       cold forceps for histology.      The cardia and gastric fundus were normal on retroflexion.      The examined duodenum was normal. Biopsies were taken with a cold       forceps for histology. Impression:           -  Z-line variable. Biopsied.                       -  Bile erosive gastritis. Biopsied.                       - Normal examined duodenum. Biopsied. Recommendation:       - Use Protonix (pantoprazole) 40 mg PO daily daily.                       - Use sucralfate tablets 1 gram PO QID for 1 month.                       - Return to GI clinic in 1 month.                       - Await pathology results. Procedure Code(s):    --- Professional ---                       [phone], Esophagogastroduodenoscopy, flexible, transoral;                        with biopsy, single or multiple Diagnosis Code(s):    --- Professional ---                       K22.8, Other specified diseases of esophagus                       K29.60, Other gastritis without bleeding                       R10.13, Epigastric pain                       R10.11, Right upper quadrant pain                       K92.1, Melena (includes Hematochezia)                       R19.7, Diarrhea, unspecified                       R63.4, Abnormal weight loss CPT copyright 2016 American Medical Association. All rights reserved. The codes documented in this report are preliminary and upon coder review may  be revised to meet current compliance requirements. Christena DeemMartin U Skulskie, MD 02/23/2016 10:01:39 AM This report has been signed electronically. Number of Addenda: 0 Note Initiated On: 02/23/2016 9:37 AM      [facility]

## 2016-02-23 NOTE — H&P (Signed)
Outpatient short stay form Pre-procedure 02/23/2016 9:34 AM Courtney DeemMartin U Reola Buckles MD  Primary Physician: Dr. Daniel NonesBert Klein  Reason for visit:  EGD and colonoscopy  History of present illness:  Patient is a 40 year old female presenting today for procedures as above. She has a history of some epigastric abdominal pain this seems to radiate towards the right upper quadrant and then around her back. She has had a gallbladder surgery in the past. Take a proton pump inhibitor once a day currently. She also has issues with diarrhea and some weight loss. She has had several episodes of seeing of black stool. She denies use of any aspirin products NSAIDs or blood thinning agents.    Current facility-administered medications:  .  0.9 %  sodium chloride infusion, , Intravenous, Continuous, Courtney DeemMartin U Monesha Monreal, MD, Last Rate: 20 mL/hr at 02/23/16 0919 .  0.9 %  sodium chloride infusion, , Intravenous, Continuous, Courtney DeemMartin U Huckleberry Martinson, MD  Prescriptions prior to admission  Medication Sig Dispense Refill Last Dose  . FLUoxetine (PROZAC) 20 MG capsule Take 1 capsule (20 mg total) by mouth 2 (two) times daily. 60 capsule 3 02/22/2016 at Unknown time  . levonorgestrel (MIRENA) 20 MCG/24HR IUD 1 each by Intrauterine route once.   02/23/2016 at Unknown time  . pantoprazole (PROTONIX) 40 MG tablet Take 40 mg by mouth daily.   02/22/2016 at Unknown time  . traZODone (DESYREL) 50 MG tablet Take 1 tablet (50 mg total) by mouth at bedtime. 30 tablet 2 Past Week at Unknown time  . Vitamin D, Ergocalciferol, (DRISDOL) 50000 units CAPS capsule Take 1 capsule (50,000 Units total) by mouth 2 (two) times a week. 30 capsule 1 Past Week at Unknown time  . diphenoxylate-atropine (LOMOTIL) 2.5-0.025 MG tablet Take 1 tablet by mouth 4 (four) times daily as needed for diarrhea or loose stools. 30 tablet 1   . lisinopril (PRINIVIL,ZESTRIL) 20 MG tablet Take 20 mg by mouth daily.   Taking  . omeprazole (PRILOSEC) 20 MG capsule Take 20 mg by  mouth daily.   Taking  . oxyCODONE-acetaminophen (PERCOCET) 5-325 MG tablet Take 2 tablets by mouth every 6 (six) hours as needed for moderate pain or severe pain. 20 tablet 0   . sertraline (ZOLOFT) 100 MG tablet Take 100 mg by mouth 2 (two) times daily.   Taking     Allergies  Allergen Reactions  . Eggs Or Egg-Derived Products Rash    Egg whites  . Other Anaphylaxis    Nuts  . Adhesive [Tape] Dermatitis    Blisters  . Septra [Sulfamethoxazole-Trimethoprim] Rash  . Sulfa Antibiotics Rash  . Vancomycin Rash     Past Medical History  Diagnosis Date  . Sarcoidosis (HCC)   . GERD (gastroesophageal reflux disease)   . Fatty liver   . Arthritis   . Hyperlipidemia   . Hypertension     Review of systems:      Physical Exam    Heart and lungs: Regular rate and rhythm without rub or gallop, lungs are bilaterally clear.    HEENT: Normocephalic atraumatic eyes are anicteric    Other:     Pertinant exam for procedure: Soft nontender nondistended bowel sounds positive normoactive.    Planned proceedures: EGD, colonoscopy and indicated procedures. I have discussed the risks benefits and complications of procedures to include not limited to bleeding, infection, perforation and the risk of sedation and the patient wishes to proceed.    Courtney DeemMartin U Javaun Dimperio, MD Gastroenterology 02/23/2016  9:34 AM

## 2016-02-23 NOTE — Transfer of Care (Signed)
Immediate Anesthesia Transfer of Care Note  Patient: Courtney NegusChristy S Simpson  Procedure(s) Performed: Procedure(s): COLONOSCOPY WITH PROPOFOL (N/A) ESOPHAGOGASTRODUODENOSCOPY (EGD) WITH PROPOFOL (N/A)  Patient Location: PACU  Anesthesia Type:General  Level of Consciousness: sedated  Airway & Oxygen Therapy: Patient Spontanous Breathing and Patient connected to nasal cannula oxygen  Post-op Assessment: Report given to RN and Post -op Vital signs reviewed and stable  Post vital signs: Reviewed and stable  Last Vitals:  Filed Vitals:   02/23/16 0856  BP: 118/80  Pulse: 98  Temp: 37.3 C  Resp: 14    Complications: No apparent anesthesia complications

## 2016-03-23 DIAGNOSIS — G43909 Migraine, unspecified, not intractable, without status migrainosus: Secondary | ICD-10-CM | POA: Insufficient documentation

## 2016-03-23 DIAGNOSIS — Z79899 Other long term (current) drug therapy: Secondary | ICD-10-CM | POA: Diagnosis not present

## 2016-03-23 DIAGNOSIS — M1990 Unspecified osteoarthritis, unspecified site: Secondary | ICD-10-CM | POA: Diagnosis not present

## 2016-03-23 DIAGNOSIS — I10 Essential (primary) hypertension: Secondary | ICD-10-CM | POA: Diagnosis not present

## 2016-03-23 DIAGNOSIS — G43009 Migraine without aura, not intractable, without status migrainosus: Secondary | ICD-10-CM

## 2016-03-23 DIAGNOSIS — E785 Hyperlipidemia, unspecified: Secondary | ICD-10-CM | POA: Insufficient documentation

## 2016-03-23 DIAGNOSIS — R079 Chest pain, unspecified: Secondary | ICD-10-CM | POA: Insufficient documentation

## 2016-03-23 NOTE — Telephone Encounter (Signed)
She needs her prozac refilled and wants to know if it comes in tablet form and not capsule. Also she isn't sure if needs to fu yet?

## 2016-03-23 NOTE — ED Notes (Signed)
Pt to ed with c/o crushing chest pain that started about 4 pm constant pain, with radiation to left arm. +sob, +dizziness, +weakness.

## 2016-03-23 NOTE — Discharge Instructions (Signed)
You have been seen in the Emergency Department (ED) today for chest pain.  As we have discussed todays test results are normal, but you may require further testing.  Please follow up with the recommended doctor as instructed above in these documents regarding todays emergent visit and your recent symptoms to discuss further management.  Continue to take your regular medications. If you are not doing so already, please also take a daily baby aspirin (81 mg), at least until you follow up with your doctor.  Return to the Emergency Department (ED) if you experience any further chest pain/pressure/tightness, difficulty breathing, or sudden sweating, or other symptoms that concern you.   Chest Pain (Nonspecific) It is often hard to give a specific diagnosis for the cause of chest pain. There is always a chance that your pain could be related to something serious, such as a heart attack or a blood clot in the lungs. You need to follow up with your health care provider for further evaluation. CAUSES   Heartburn.  Pneumonia or bronchitis.  Anxiety or stress.  Inflammation around your heart (pericarditis) or lung (pleuritis or pleurisy).  A blood clot in the lung.  A collapsed lung (pneumothorax). It can develop suddenly on its own (spontaneous pneumothorax) or from trauma to the chest.  Shingles infection (herpes zoster virus). The chest wall is composed of bones, muscles, and cartilage. Any of these can be the source of the pain.  The bones can be bruised by injury.  The muscles or cartilage can be strained by coughing or overwork.  The cartilage can be affected by inflammation and become sore (costochondritis). DIAGNOSIS  Lab tests or other studies may be needed to find the cause of your pain. Your health care provider may have you take a test called an ambulatory electrocardiogram (ECG). An ECG records your heartbeat patterns over a 24-hour period. You may also have other tests, such  as:  Transthoracic echocardiogram (TTE). During echocardiography, sound waves are used to evaluate how blood flows through your heart.  Transesophageal echocardiogram (TEE).  Cardiac monitoring. This allows your health care provider to monitor your heart rate and rhythm in real time.  Holter monitor. This is a portable device that records your heartbeat and can help diagnose heart arrhythmias. It allows your health care provider to track your heart activity for several days, if needed.  Stress tests by exercise or by giving medicine that makes the heart beat faster. TREATMENT   Treatment depends on what may be causing your chest pain. Treatment may include:  Acid blockers for heartburn.  Anti-inflammatory medicine.  Pain medicine for inflammatory conditions.  Antibiotics if an infection is present.  You may be advised to change lifestyle habits. This includes stopping smoking and avoiding alcohol, caffeine, and chocolate.  You may be advised to keep your head raised (elevated) when sleeping. This reduces the chance of acid going backward from your stomach into your esophagus. Most of the time, nonspecific chest pain will improve within 2-3 days with rest and mild pain medicine.  HOME CARE INSTRUCTIONS   If antibiotics were prescribed, take them as directed. Finish them even if you start to feel better.  For the next few days, avoid physical activities that bring on chest pain. Continue physical activities as directed.  Do not use any tobacco products, including cigarettes, chewing tobacco, or electronic cigarettes.  Avoid drinking alcohol.  Only take medicine as directed by your health care provider.  Follow your health care provider's suggestions for  further testing if your chest pain does not go away.  Keep any follow-up appointments you made. If you do not go to an appointment, you could develop lasting (chronic) problems with pain. If there is any problem keeping an  appointment, call to reschedule. SEEK MEDICAL CARE IF:   Your chest pain does not go away, even after treatment.  You have a rash with blisters on your chest.  You have a fever. SEEK IMMEDIATE MEDICAL CARE IF:   You have increased chest pain or pain that spreads to your arm, neck, jaw, back, or abdomen.  You have shortness of breath.  You have an increasing cough, or you cough up blood.  You have severe back or abdominal pain.  You feel nauseous or vomit.  You have severe weakness.  You faint.  You have chills. This is an emergency. Do not wait to see if the pain will go away. Get medical help at once. Call your local emergency services (911 in [place] Do not drive yourself to the hospital. MAKE SURE YOU:   Understand these instructions.  Will watch your condition.  Will get help right away if you are not doing well or get worse. Document Released: 08/11/2005 Document Revised: 11/06/2013 Document Reviewed: 06/06/2008 [facility] Patient Information 2015 [employer] [place] [employer], [place]. This information is not intended to replace advice given to you by your health care provider. Make sure you discuss any questions you have with your health care provider.    Migraine Headache A migraine headache is an intense, throbbing pain on one or both sides of your head. A migraine can last for 30 minutes to several hours. CAUSES  The exact cause of a migraine headache is not always known. However, a migraine may be caused when nerves in the brain become irritated and release chemicals that cause inflammation. This causes pain. Certain things may also trigger migraines, such as:  Alcohol.  Smoking.  Stress.  Menstruation.  Aged cheeses.  Foods or drinks that contain nitrates, glutamate, aspartame, or tyramine.  Lack of sleep.  Chocolate.  Caffeine.  Hunger.  Physical exertion.  Fatigue.  Medicines used to treat chest pain (nitroglycerine), birth control pills, estrogen, and some  blood pressure medicines. SIGNS AND SYMPTOMS  Pain on one or both sides of your head.  Pulsating or throbbing pain.  Severe pain that prevents daily activities.  Pain that is aggravated by any physical activity.  Nausea, vomiting, or both.  Dizziness.  Pain with exposure to bright lights, loud noises, or activity.  General sensitivity to bright lights, loud noises, or smells. Before you get a migraine, you may get warning signs that a migraine is coming (aura). An aura may include:  Seeing flashing lights.  Seeing bright spots, halos, or zigzag lines.  Having tunnel vision or blurred vision.  Having feelings of numbness or tingling.  Having trouble talking.  Having muscle weakness. DIAGNOSIS  A migraine headache is often diagnosed based on:  Symptoms.  Physical exam.  A CT scan or MRI of your head. These imaging tests cannot diagnose migraines, but they can help rule out other causes of headaches. TREATMENT Medicines may be given for pain and nausea. Medicines can also be given to help prevent recurrent migraines.  HOME CARE INSTRUCTIONS  Only take over-the-counter or prescription medicines for pain or discomfort as directed by your health care provider. The use of long-term narcotics is not recommended.  Lie down in a dark, quiet room when you have a migraine.  Keep a  journal to find out what may trigger your migraine headaches. For example, write down:  What you eat and drink.  How much sleep you get.  Any change to your diet or medicines.  Limit alcohol consumption.  Quit smoking if you smoke.  Get 7-9 hours of sleep, or as recommended by your health care provider.  Limit stress.  Keep lights dim if bright lights bother you and make your migraines worse. SEEK IMMEDIATE MEDICAL CARE IF:   Your migraine becomes severe.  You have a fever.  You have a stiff neck.  You have vision loss.  You have muscular weakness or loss of muscle  control.  You start losing your balance or have trouble walking.  You feel faint or pass out.  You have severe symptoms that are different from your first symptoms. MAKE SURE YOU:   Understand these instructions.  Will watch your condition.  Will get help right away if you are not doing well or get worse.   This information is not intended to replace advice given to you by your health care provider. Make sure you discuss any questions you have with your health care provider.   Document Released: 11/01/2005 Document Revised: 11/22/2014 Document Reviewed: 07/09/2013 Elsevier Interactive Patient Education [employer]

## 2016-03-23 NOTE — Telephone Encounter (Signed)
Spoke with pt

## 2016-03-23 NOTE — ED Notes (Signed)
Patient transported to X-ray 

## 2016-04-20 DIAGNOSIS — Z1231 Encounter for screening mammogram for malignant neoplasm of breast: Secondary | ICD-10-CM

## 2016-05-14 DIAGNOSIS — Z1231 Encounter for screening mammogram for malignant neoplasm of breast: Secondary | ICD-10-CM

## 2016-05-17 DIAGNOSIS — N63 Unspecified lump in unspecified breast: Secondary | ICD-10-CM

## 2016-05-28 DIAGNOSIS — N63 Unspecified lump in unspecified breast: Secondary | ICD-10-CM

## 2016-06-14 DIAGNOSIS — R55 Syncope and collapse: Secondary | ICD-10-CM

## 2016-06-14 DIAGNOSIS — R410 Disorientation, unspecified: Secondary | ICD-10-CM

## 2016-06-14 DIAGNOSIS — G44319 Acute post-traumatic headache, not intractable: Secondary | ICD-10-CM | POA: Diagnosis not present

## 2017-01-07 DIAGNOSIS — R404 Transient alteration of awareness: Secondary | ICD-10-CM

## 2017-01-07 DIAGNOSIS — R202 Paresthesia of skin: Secondary | ICD-10-CM

## 2017-01-07 DIAGNOSIS — R251 Tremor, unspecified: Secondary | ICD-10-CM

## 2017-01-26 DIAGNOSIS — R251 Tremor, unspecified: Secondary | ICD-10-CM | POA: Diagnosis present

## 2017-01-26 DIAGNOSIS — R404 Transient alteration of awareness: Secondary | ICD-10-CM | POA: Diagnosis present

## 2017-01-26 DIAGNOSIS — R202 Paresthesia of skin: Secondary | ICD-10-CM | POA: Insufficient documentation

## 2017-05-06 VITALS — BP 121/95 | HR 96 | Ht 62.0 in | Wt 221.0 lb

## 2017-05-06 DIAGNOSIS — B359 Dermatophytosis, unspecified: Secondary | ICD-10-CM | POA: Diagnosis not present

## 2017-05-06 NOTE — Progress Notes (Signed)
Subjective:     Patient ID: Courtney NegusChristy S Simpson, female   DOB: 04-25-1976, 41 y.o.   MRN: [id number]  HPI Rash on groin and  mons pubis x 1 week. Used corn starch and cortisone 10. Not helping. Intense itching-worse at night. Not had similar rash in past. Denies changing any hygiene products or pool products.   Review of Systems Negative except stated above in HPI.    Objective:   Physical Exam A&O x4 Well groomed female in no distress Blood pressure (!) 121/95, pulse 96, height 5\' 2"  (1.575 m), weight 221 lb (100.2 kg).  Body mass index is 40.42 kg/m.  Red raised rash bilaterally in groin and under abdominal flap.     Assessment:     Tinea of groin    Plan:     Diflucan po x 5 days terazole cream to affected area BID x 5 days as needed.  Melody Blue RidgeShambley, CNM

## 2017-05-26 DIAGNOSIS — Z1231 Encounter for screening mammogram for malignant neoplasm of breast: Secondary | ICD-10-CM

## 2017-06-07 VITALS — BP 128/102 | HR 77 | Ht 61.5 in | Wt 220.6 lb

## 2017-06-07 DIAGNOSIS — Z01419 Encounter for gynecological examination (general) (routine) without abnormal findings: Secondary | ICD-10-CM | POA: Diagnosis not present

## 2017-06-07 NOTE — Patient Instructions (Signed)
Preventive Care 18-39 Years, Female Preventive care refers to lifestyle choices and visits with your health care provider that can promote health and wellness. What does preventive care include?  A yearly physical exam. This is also called an annual well check.  Dental exams once or twice a year.  Routine eye exams. Ask your health care provider how often you should have your eyes checked.  Personal lifestyle choices, including: ? Daily care of your teeth and gums. ? Regular physical activity. ? Eating a healthy diet. ? Avoiding tobacco and drug use. ? Limiting alcohol use. ? Practicing safe sex. ? Taking vitamin and mineral supplements as recommended by your health care provider. What happens during an annual well check? The services and screenings done by your health care provider during your annual well check will depend on your age, overall health, lifestyle risk factors, and family history of disease. Counseling Your health care provider may ask you questions about your:  Alcohol use.  Tobacco use.  Drug use.  Emotional well-being.  Home and relationship well-being.  Sexual activity.  Eating habits.  Work and work Statistician.  Method of birth control.  Menstrual cycle.  Pregnancy history.  Screening You may have the following tests or measurements:  Height, weight, and BMI.  Diabetes screening. This is done by checking your blood sugar (glucose) after you have not eaten for a while (fasting).  Blood pressure.  Lipid and cholesterol levels. These may be checked every 5 years starting at age 38.  Skin check.  Hepatitis C blood test.  Hepatitis B blood test.  Sexually transmitted disease (STD) testing.  BRCA-related cancer screening. This may be done if you have a family history of breast, ovarian, tubal, or peritoneal cancers.  Pelvic exam and Pap test. This may be done every 3 years starting at age 38. Starting at age 30, this may be done  every 5 years if you have a Pap test in combination with an HPV test.  Discuss your test results, treatment options, and if necessary, the need for more tests with your health care provider. Vaccines Your health care provider may recommend certain vaccines, such as:  Influenza vaccine. This is recommended every year.  Tetanus, diphtheria, and acellular pertussis (Tdap, Td) vaccine. You may need a Td booster every 10 years.  Varicella vaccine. You may need this if you have not been vaccinated.  HPV vaccine. If you are 39 or younger, you may need three doses over 6 months.  Measles, mumps, and rubella (MMR) vaccine. You may need at least one dose of MMR. You may also need a second dose.  Pneumococcal 13-valent conjugate (PCV13) vaccine. You may need this if you have certain conditions and were not previously vaccinated.  Pneumococcal polysaccharide (PPSV23) vaccine. You may need one or two doses if you smoke cigarettes or if you have certain conditions.  Meningococcal vaccine. One dose is recommended if you are age 68-21 years and a first-year college student living in a residence hall, or if you have one of several medical conditions. You may also need additional booster doses.  Hepatitis A vaccine. You may need this if you have certain conditions or if you travel or work in places where you may be exposed to hepatitis A.  Hepatitis B vaccine. You may need this if you have certain conditions or if you travel or work in places where you may be exposed to hepatitis B.  Haemophilus influenzae type b (Hib) vaccine. You may need this  if you have certain risk factors.  Talk to your health care provider about which screenings and vaccines you need and how often you need them. This information is not intended to replace advice given to you by your health care provider. Make sure you discuss any questions you have with your health care provider. Document Released: 12/28/2001 Document Revised:  07/21/2016 Document Reviewed: 09/02/2015 Elsevier Interactive Patient Education  2017 Elsevier Inc.  

## 2017-06-09 DIAGNOSIS — Z1231 Encounter for screening mammogram for malignant neoplasm of breast: Secondary | ICD-10-CM | POA: Insufficient documentation

## 2017-10-18 DIAGNOSIS — M25521 Pain in right elbow: Secondary | ICD-10-CM

## 2017-10-18 DIAGNOSIS — M542 Cervicalgia: Secondary | ICD-10-CM

## 2017-10-25 DIAGNOSIS — M5412 Radiculopathy, cervical region: Secondary | ICD-10-CM

## 2017-10-27 DIAGNOSIS — M5412 Radiculopathy, cervical region: Secondary | ICD-10-CM

## 2017-10-27 DIAGNOSIS — M25521 Pain in right elbow: Secondary | ICD-10-CM

## 2018-06-08 VITALS — BP 135/90 | HR 83 | Ht 61.0 in | Wt 217.2 lb

## 2018-06-08 DIAGNOSIS — E559 Vitamin D deficiency, unspecified: Secondary | ICD-10-CM

## 2018-06-08 DIAGNOSIS — Z01419 Encounter for gynecological examination (general) (routine) without abnormal findings: Secondary | ICD-10-CM

## 2018-06-08 DIAGNOSIS — B379 Candidiasis, unspecified: Secondary | ICD-10-CM | POA: Diagnosis not present

## 2018-06-08 NOTE — Progress Notes (Signed)
Subjective:   Courtney Simpson is a 42 y.o. G1P1 Caucasian female here for a routine well-woman exam.  No LMP recorded. (Menstrual status: IUD).    Current complaints: none, depression due to loss of father this year. PCP: Marikay Alar       doesn't desire labs  Social History: Sexual: heterosexual Marital Status: married Living situation: with family Occupation: unknown occupation Tobacco/alcohol: no tobacco use Illicit drugs: no history of illicit drug use  The following portions of the patient's history were reviewed and updated as appropriate: allergies, current medications, past family history, past medical history, past social history, past surgical history and problem list.  Past Medical History Past Medical History:  Diagnosis Date  . Arthritis   . Fatty liver   . GERD (gastroesophageal reflux disease)   . Hyperlipidemia   . Hypertension   . Sarcoidosis     Past Surgical History Past Surgical History:  Procedure Laterality Date  . ANTERIOR CRUCIATE LIGAMENT REPAIR Bilateral   . back surgery    . CHOLECYSTECTOMY    . COLONOSCOPY WITH PROPOFOL N/A 02/23/2016   Procedure: COLONOSCOPY WITH PROPOFOL;  Surgeon: Christena Deem, MD;  Location: [facility] ENDOSCOPY;  Service: Endoscopy;  Laterality: N/A;  . ESOPHAGOGASTRODUODENOSCOPY (EGD) WITH PROPOFOL N/A 02/23/2016   Procedure: ESOPHAGOGASTRODUODENOSCOPY (EGD) WITH PROPOFOL;  Surgeon: Christena Deem, MD;  Location: [facility] ENDOSCOPY;  Service: Endoscopy;  Laterality: N/A;    Gynecologic History G1P1  No LMP recorded. (Menstrual status: IUD). Contraception: IUD Last Pap: 2018. Results were: normal Last mammogram: 05/2017. Results were: normal   Obstetric History OB History  Gravida Para Term Preterm AB Living  1 1       1   SAB TAB Ectopic Multiple Live Births               # Outcome Date GA Lbr Len/2nd Weight Sex Delivery Anes PTL Lv  1 Para             Obstetric Comments  1st Menstrual Cycle:  10  1st Pregnancy:  24     Current Medications Current Outpatient Medications on File Prior to Visit  Medication Sig Dispense Refill  . FLUoxetine (PROZAC) 20 MG capsule TAKE ONE CAPSULE BY MOUTH TWICE DAILY 60 capsule 3  . levonorgestrel (MIRENA) 20 MCG/24HR IUD 1 each by Intrauterine route once.    . pantoprazole (PROTONIX) 40 MG tablet Take 40 mg by mouth daily.    . sertraline (ZOLOFT) 100 MG tablet Take 100 mg by mouth 2 (two) times daily.    . Vitamin D, Ergocalciferol, (DRISDOL) 50000 units CAPS capsule Take 1 capsule (50,000 Units total) by mouth 2 (two) times a week. 30 capsule 1  . fluconazole (DIFLUCAN) 100 MG tablet Take 1 tablet (100 mg total) by mouth daily. (Patient not taking: Reported on 06/07/2017) 5 tablet 1  . lisinopril (PRINIVIL,ZESTRIL) 20 MG tablet Take 20 mg by mouth daily.    Marland Kitchen terconazole (TERAZOL 7) 0.4 % vaginal cream Place 1 applicator vaginally at bedtime. (Patient not taking: Reported on 06/07/2017) 45 g 0  . topiramate (TOPAMAX) 100 MG tablet Take 100 mg by mouth 2 (two) times daily.     No current facility-administered medications on file prior to visit.     Review of Systems Patient denies any headaches, blurred vision, shortness of breath, chest pain, abdominal pain, problems with bowel movements, urination, or intercourse.  Objective:  BP 135/90   Pulse 83   Ht 5\' 1"  (1.549 m)   Wt  217 lb 3.2 oz (98.5 kg)   BMI 41.04 kg/m  Physical Exam  General:  Well developed, well nourished, no acute distress. She is alert and oriented x3. Skin:  Warm and dry Neck:  Midline trachea, no thyromegaly or nodules Cardiovascular: Regular rate and rhythm, no murmur heard Lungs:  Effort normal, all lung fields clear to auscultation bilaterally Breasts:  No dominant palpable mass, retraction, or nipple discharge Abdomen:  Soft, non tender, no hepatosplenomegaly or masses Pelvic:  External genitalia is normal in appearance.  The vagina is normal in appearance. The cervix is bulbous, no CMT.  IUD string noted  Thin prep pap is not done. Uterus is felt to be normal size, shape, and contour.  No adnexal masses or tenderness noted. Extremities:  No swelling or varicosities noted Psych:  She has a normal mood and affect  Assessment:   Healthy well-woman exam Depression under control with SSRI Obesity Pre-diabetes Vit D deficiency Elevated cholesterol Vaginal yeast infection  Plan:  terazol sent in and instructed on use. F/U 1 year for AE, or sooner if needed Mammogram ordered  Melody Suzan NailerN Shambley, CNM

## 2018-06-08 NOTE — Patient Instructions (Signed)
Preventive Care 18-39 Years, Female Preventive care refers to lifestyle choices and visits with your health care provider that can promote health and wellness. What does preventive care include?  A yearly physical exam. This is also called an annual well check.  Dental exams once or twice a year.  Routine eye exams. Ask your health care provider how often you should have your eyes checked.  Personal lifestyle choices, including: ? Daily care of your teeth and gums. ? Regular physical activity. ? Eating a healthy diet. ? Avoiding tobacco and drug use. ? Limiting alcohol use. ? Practicing safe sex. ? Taking vitamin and mineral supplements as recommended by your health care provider. What happens during an annual well check? The services and screenings done by your health care provider during your annual well check will depend on your age, overall health, lifestyle risk factors, and family history of disease. Counseling Your health care provider may ask you questions about your:  Alcohol use.  Tobacco use.  Drug use.  Emotional well-being.  Home and relationship well-being.  Sexual activity.  Eating habits.  Work and work Statistician.  Method of birth control.  Menstrual cycle.  Pregnancy history.  Screening You may have the following tests or measurements:  Height, weight, and BMI.  Diabetes screening. This is done by checking your blood sugar (glucose) after you have not eaten for a while (fasting).  Blood pressure.  Lipid and cholesterol levels. These may be checked every 5 years starting at age 38.  Skin check.  Hepatitis C blood test.  Hepatitis B blood test.  Sexually transmitted disease (STD) testing.  BRCA-related cancer screening. This may be done if you have a family history of breast, ovarian, tubal, or peritoneal cancers.  Pelvic exam and Pap test. This may be done every 3 years starting at age 38. Starting at age 30, this may be done  every 5 years if you have a Pap test in combination with an HPV test.  Discuss your test results, treatment options, and if necessary, the need for more tests with your health care provider. Vaccines Your health care provider may recommend certain vaccines, such as:  Influenza vaccine. This is recommended every year.  Tetanus, diphtheria, and acellular pertussis (Tdap, Td) vaccine. You may need a Td booster every 10 years.  Varicella vaccine. You may need this if you have not been vaccinated.  HPV vaccine. If you are 39 or younger, you may need three doses over 6 months.  Measles, mumps, and rubella (MMR) vaccine. You may need at least one dose of MMR. You may also need a second dose.  Pneumococcal 13-valent conjugate (PCV13) vaccine. You may need this if you have certain conditions and were not previously vaccinated.  Pneumococcal polysaccharide (PPSV23) vaccine. You may need one or two doses if you smoke cigarettes or if you have certain conditions.  Meningococcal vaccine. One dose is recommended if you are age 68-21 years and a first-year college student living in a residence hall, or if you have one of several medical conditions. You may also need additional booster doses.  Hepatitis A vaccine. You may need this if you have certain conditions or if you travel or work in places where you may be exposed to hepatitis A.  Hepatitis B vaccine. You may need this if you have certain conditions or if you travel or work in places where you may be exposed to hepatitis B.  Haemophilus influenzae type b (Hib) vaccine. You may need this  if you have certain risk factors.  Talk to your health care provider about which screenings and vaccines you need and how often you need them. This information is not intended to replace advice given to you by your health care provider. Make sure you discuss any questions you have with your health care provider. Document Released: 12/28/2001 Document Revised:  07/21/2016 Document Reviewed: 09/02/2015 Elsevier Interactive Patient Education  2018 Elsevier Inc.  

## 2018-12-25 DIAGNOSIS — M25662 Stiffness of left knee, not elsewhere classified: Secondary | ICD-10-CM | POA: Diagnosis present

## 2018-12-25 DIAGNOSIS — M25562 Pain in left knee: Secondary | ICD-10-CM | POA: Diagnosis present

## 2018-12-25 DIAGNOSIS — S83272A Complex tear of lateral meniscus, current injury, left knee, initial encounter: Secondary | ICD-10-CM | POA: Insufficient documentation

## 2018-12-25 DIAGNOSIS — R269 Unspecified abnormalities of gait and mobility: Secondary | ICD-10-CM | POA: Insufficient documentation

## 2018-12-25 DIAGNOSIS — M6281 Muscle weakness (generalized): Secondary | ICD-10-CM | POA: Diagnosis present

## 2018-12-25 NOTE — Patient Instructions (Signed)
Access Code: [email or web address]  URL: [email or web address]  Date: 12/25/2018  Prepared by: Dorene GrebeMichael Karalyn Kadel   Exercises  Supine Quad Set - 10 reps - 1 sets - 10 hold - 2x daily - 7x weekly  Supine Heel Slide - 10 reps - 2 sets - 5 hold - 2x daily - 7x weekly  Supine Short Arc Quad - 10 reps - 2 sets - 5 hold - 2x daily - 7x weekly  Active Straight Leg Raise with Quad Set - 10 reps - 2 sets - 5 hold - 2x daily - 7x weekly  Supine Hip Abduction - 10 reps - 2 sets - 2x daily - 7x weekly  Beginner Bridge - 10 reps - 2 sets - 3 hold - 2x daily - 7x weekly

## 2018-12-28 DIAGNOSIS — M25662 Stiffness of left knee, not elsewhere classified: Secondary | ICD-10-CM

## 2018-12-28 DIAGNOSIS — M25562 Pain in left knee: Secondary | ICD-10-CM

## 2018-12-28 DIAGNOSIS — M6281 Muscle weakness (generalized): Secondary | ICD-10-CM

## 2018-12-28 DIAGNOSIS — S83272A Complex tear of lateral meniscus, current injury, left knee, initial encounter: Secondary | ICD-10-CM | POA: Diagnosis not present

## 2018-12-28 DIAGNOSIS — R269 Unspecified abnormalities of gait and mobility: Secondary | ICD-10-CM
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
## 2018-12-31 NOTE — Addendum Note (Signed)
Addended by: Cammie Mcgee on: 12/31/2018 05:56 PM   Modules accepted: Orders

## 2019-01-01 DIAGNOSIS — M25562 Pain in left knee: Secondary | ICD-10-CM

## 2019-01-01 DIAGNOSIS — M25662 Stiffness of left knee, not elsewhere classified: Secondary | ICD-10-CM

## 2019-01-01 DIAGNOSIS — S83272A Complex tear of lateral meniscus, current injury, left knee, initial encounter: Secondary | ICD-10-CM

## 2019-01-01 DIAGNOSIS — M6281 Muscle weakness (generalized): Secondary | ICD-10-CM

## 2019-01-01 DIAGNOSIS — R269 Unspecified abnormalities of gait and mobility: Secondary | ICD-10-CM

## 2019-01-08 DIAGNOSIS — M25562 Pain in left knee: Secondary | ICD-10-CM

## 2019-01-08 DIAGNOSIS — S83272A Complex tear of lateral meniscus, current injury, left knee, initial encounter: Secondary | ICD-10-CM | POA: Diagnosis not present

## 2019-01-08 DIAGNOSIS — R269 Unspecified abnormalities of gait and mobility: Secondary | ICD-10-CM

## 2019-01-08 DIAGNOSIS — M6281 Muscle weakness (generalized): Secondary | ICD-10-CM

## 2019-01-08 DIAGNOSIS — M25662 Stiffness of left knee, not elsewhere classified: Secondary | ICD-10-CM

## 2019-01-11 DIAGNOSIS — M25662 Stiffness of left knee, not elsewhere classified: Secondary | ICD-10-CM

## 2019-01-11 DIAGNOSIS — R269 Unspecified abnormalities of gait and mobility: Secondary | ICD-10-CM

## 2019-01-11 DIAGNOSIS — S83272A Complex tear of lateral meniscus, current injury, left knee, initial encounter: Secondary | ICD-10-CM

## 2019-01-11 DIAGNOSIS — M6281 Muscle weakness (generalized): Secondary | ICD-10-CM

## 2019-01-11 DIAGNOSIS — M25562 Pain in left knee: Secondary | ICD-10-CM

## 2019-01-11 NOTE — Therapy (Signed)
[facility] [address] [place], [place], [address] Phone: [phone]   Fax:  [phone]  Physical Therapy Treatment  Patient Details  Name: AMOY FARVER MRN: [id number] Date of Birth: 1976-05-22 Referring Provider (PT):  Dr. Ardine Eng   Encounter Date: 01/11/2019  PT End of Session - 01/11/19 1323    Visit Number  5    Number of Visits  9    Date for PT Re-Evaluation  01/22/19    PT Start Time  0947    PT Stop Time  1032    PT Time Calculation (min)  45 min    Activity Tolerance  Patient tolerated treatment well    Behavior During Therapy  [facility] for tasks assessed/performed       Past Medical History:  Diagnosis Date  . Arthritis   . Fatty liver   . GERD (gastroesophageal reflux disease)   . Hyperlipidemia   . Hypertension   . Sarcoidosis     Past Surgical History:  Procedure Laterality Date  . ANTERIOR CRUCIATE LIGAMENT REPAIR Bilateral   . back surgery    . CHOLECYSTECTOMY    . COLONOSCOPY WITH PROPOFOL N/A 02/23/2016   Procedure: COLONOSCOPY WITH PROPOFOL;  Surgeon: Christena Deem, MD;  Location: [facility] ENDOSCOPY;  Service: Endoscopy;  Laterality: N/A;  . ESOPHAGOGASTRODUODENOSCOPY (EGD) WITH PROPOFOL N/A 02/23/2016   Procedure: ESOPHAGOGASTRODUODENOSCOPY (EGD) WITH PROPOFOL;  Surgeon: Christena Deem, MD;  Location: [facility] ENDOSCOPY;  Service: Endoscopy;  Laterality: N/A;    There were no vitals filed for this visit.    Pt. states she is doing okay but tired. No reports of knee pain at this time but pt. hasn't been too active this morning.       Therex:   TG calf raises 2x15 (PT provided mod verbal cueing to keep knees extended)  TG squats 2x15 TG single leg squats with left leg with heavy assist from PT 1x8 (PT provided mod assist for pt. To complete concentric phase of squat)  SAQ with 4 lbs ankle wt.  [place] shells with RTB 2x12 Modifiied left leg press isometrics 2x8 (PT provided resistance to  left leg)  Standing resisted TKE on left leg with GTB to incorporate quality quad strengthening (2x15) (PT provide min verbal cueing for pt. To achieve full knee extension each rep)      PT Long Term Goals - 12/31/18 1744      PT LONG TERM GOAL #1   Title  Pt. will increase FOTO to 50 to improve pain-free functional mobility.      Baseline  Initial FOTO on 2/10: 30    Time  4    Period  Weeks    Status  New    Target Date  01/22/19      PT LONG TERM GOAL #2   Title  Pt. will increase L knee AROM to WNL as compared to R knee to improve gait pattern.      Baseline  Supine R knee AROM: -1 to 132 deg. Great B hamstring flexibility. L knee AROM: -3 to 104 deg.     Time  4    Period  Weeks    Status  New    Target Date  01/22/19      PT LONG TERM GOAL #3   Title  Pt. will increase L quad/ hamstring strength to grossly 5/5 MMT to improve pain-free mobility with step ups/ gait.      Baseline  R LE muscle strength grossly 5/5 MMT except hip flexion 4+/5 MMT. L LE muscle strength grossly 4+/5 MMT (slight L knee discomfort with resisted knee extension).    Time  4    Period  Weeks    Status  New    Target Date  01/22/19      PT LONG TERM GOAL #4   Title  Pt. able to ambulate community distance with normalized gait pattern and no c/o L knee pain to improve overall mobility.      Baseline  L antalgic gait/ generalized LE muscle fatigue.     Time  4    Period  Weeks    Status  New    Target Date  01/22/19      PT LONG TERM GOAL #5   Title  Pt. able to step up/ down curb with L LE and no c/o pain to improve pain-free mobility.      Baseline  L knee pain with recip. step ups.     Time  4    Period  Weeks    Status  New    Target Date  01/22/19            Plan - 01/11/19 1334    Clinical Impression Statement  Pt. Entered clinic today with more fatigue than usual but was able tolerated therex well today without increase in pain. Pt. Is limited by decreased strength of quad  muscle noted during SAQ. Pt demonstrated improved reciprocal gait pattern with normalized arm swing and heel strike at the end of therex. Pt. Will benefit from skilled PT to continue to improve quad strength to maintain normalized gait pattern at all times throughout the day and not just after manual/therex at PT.     Clinical Presentation  Stable    Clinical Decision Making  Low    Rehab Potential  Good    PT Frequency  2x / week    PT Duration  4 weeks    PT Treatment/Interventions  ADLs/Self Care Home Management;Aquatic Therapy;Cryotherapy;Electrical Stimulation;Moist Heat;Gait training;Stair training;Functional mobility training;Neuromuscular re-education;Balance training;Therapeutic exercise;Therapeutic activities;Patient/family education;Manual techniques;Passive range of motion;Scar mobilization    PT Next Visit Plan  Progress L quad stability/ start scar massage if appropriate    PT Home Exercise Plan  See handouts       Patient will benefit from skilled therapeutic intervention in order to improve the following deficits and impairments:  Abnormal gait, Pain, Improper body mechanics, Decreased scar mobility, Decreased mobility, Decreased activity tolerance, Decreased endurance, Decreased range of motion, Decreased strength, Obesity, Difficulty walking  Visit Diagnosis: Complex tear of lateral meniscus of left knee as current injury, initial encounter  Acute pain of left knee  Joint stiffness of knee, left  Muscle weakness (generalized)  Gait difficulty     Problem List Patient Active Problem List   Diagnosis Date Noted  . Vitamin D deficiency 11/27/2015  . Gallstones 02/05/2014  . Morbid obesity (HCC) 02/05/2014   Cammie Mcgee, PT, DPT # [phone] Rosalyn Charters, SPT 01/11/2019, 5:38 PM  [facility] [address] [place], [place], [address] Phone: [phone]   Fax:  [phone]  Name: BRAELIE TILLERSON MRN: [id number] Date  of Birth: November 14, 1976

## 2019-01-15 DIAGNOSIS — M25562 Pain in left knee: Secondary | ICD-10-CM | POA: Insufficient documentation

## 2019-01-15 DIAGNOSIS — M6281 Muscle weakness (generalized): Secondary | ICD-10-CM | POA: Insufficient documentation

## 2019-01-15 DIAGNOSIS — M25662 Stiffness of left knee, not elsewhere classified: Secondary | ICD-10-CM | POA: Diagnosis present

## 2019-01-15 DIAGNOSIS — R269 Unspecified abnormalities of gait and mobility: Secondary | ICD-10-CM | POA: Insufficient documentation

## 2019-01-15 DIAGNOSIS — S83272A Complex tear of lateral meniscus, current injury, left knee, initial encounter: Secondary | ICD-10-CM | POA: Insufficient documentation
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
## 2019-06-14 DIAGNOSIS — E559 Vitamin D deficiency, unspecified: Secondary | ICD-10-CM

## 2019-06-14 DIAGNOSIS — Z1231 Encounter for screening mammogram for malignant neoplasm of breast: Secondary | ICD-10-CM

## 2019-06-14 DIAGNOSIS — Z01419 Encounter for gynecological examination (general) (routine) without abnormal findings: Secondary | ICD-10-CM | POA: Diagnosis not present

## 2019-06-14 DIAGNOSIS — Z30431 Encounter for routine checking of intrauterine contraceptive device: Secondary | ICD-10-CM

## 2019-06-14 NOTE — Patient Instructions (Addendum)
Preventive Care 45-45 Years Old, Female Preventive care refers to visits with your health care provider and lifestyle choices that can promote health and wellness. This includes:  A yearly physical exam. This may also be called an annual well check.  Regular dental visits and eye exams.  Immunizations.  Screening for certain conditions.  Healthy lifestyle choices, such as eating a healthy diet, getting regular exercise, not using drugs or products that contain nicotine and tobacco, and limiting alcohol use. What can I expect for my preventive care visit? Physical exam Your health care provider will check your:  Height and weight. This may be used to calculate body mass index (BMI), which tells if you are at a healthy weight.  Heart rate and blood pressure.  Skin for abnormal spots. Counseling Your health care provider may ask you questions about your:  Alcohol, tobacco, and drug use.  Emotional well-being.  Home and relationship well-being.  Sexual activity.  Eating habits.  Work and work Statistician.  Method of birth control.  Menstrual cycle.  Pregnancy history. What immunizations do I need?  Influenza (flu) vaccine  This is recommended every year. Tetanus, diphtheria, and pertussis (Tdap) vaccine  You may need a Td booster every 10 years. Varicella (chickenpox) vaccine  You may need this if you have not been vaccinated. Human papillomavirus (HPV) vaccine  If recommended by your health care provider, you may need three doses over 6 months. Measles, mumps, and rubella (MMR) vaccine  You may need at least one dose of MMR. You may also need a second dose. Meningococcal conjugate (MenACWY) vaccine  One dose is recommended if you are age 65-21 years and a first-year college student living in a residence hall, or if you have one of several medical conditions. You may also need additional booster doses. Pneumococcal conjugate (PCV13) vaccine  You may need  this if you have certain conditions and were not previously vaccinated. Pneumococcal polysaccharide (PPSV23) vaccine  You may need one or two doses if you smoke cigarettes or if you have certain conditions. Hepatitis A vaccine  You may need this if you have certain conditions or if you travel or work in places where you may be exposed to hepatitis A. Hepatitis B vaccine  You may need this if you have certain conditions or if you travel or work in places where you may be exposed to hepatitis B. Haemophilus influenzae type b (Hib) vaccine  You may need this if you have certain conditions. You may receive vaccines as individual doses or as more than one vaccine together in one shot (combination vaccines). Talk with your health care provider about the risks and benefits of combination vaccines. What tests do I need?  Blood tests  Lipid and cholesterol levels. These may be checked every 5 years starting at age 13.  Hepatitis C test.  Hepatitis B test. Screening  Diabetes screening. This is done by checking your blood sugar (glucose) after you have not eaten for a while (fasting).  Sexually transmitted disease (STD) testing.  BRCA-related cancer screening. This may be done if you have a family history of breast, ovarian, tubal, or peritoneal cancers.  Pelvic exam and Pap test. This may be done every 3 years starting at age 34. Starting at age 76, this may be done every 5 years if you have a Pap test in combination with an HPV test. Talk with your health care provider about your test results, treatment options, and if necessary, the need for more  tests. Follow these instructions at home: Eating and drinking   Eat a diet that includes fresh fruits and vegetables, whole grains, lean protein, and low-fat dairy.  Take vitamin and mineral supplements as recommended by your health care provider.  Do not drink alcohol if: ? Your health care provider tells you not to drink. ? You are  pregnant, may be pregnant, or are planning to become pregnant.  If you drink alcohol: ? Limit how much you have to 0-1 drink a day. ? Be aware of how much alcohol is in your drink. In the [place] one drink equals one 12 oz bottle of beer (355 mL), one 5 oz glass of wine (148 mL), or one 1 oz glass of hard liquor (44 mL). Lifestyle  Take daily care of your teeth and gums.  Stay active. Exercise for at least 30 minutes on 5 or more days each week.  Do not use any products that contain nicotine or tobacco, such as cigarettes, e-cigarettes, and chewing tobacco. If you need help quitting, ask your health care provider.  If you are sexually active, practice safe sex. Use a condom or other form of birth control (contraception) in order to prevent pregnancy and STIs (sexually transmitted infections). If you plan to become pregnant, see your health care provider for a preconception visit. What's next?  Visit your health care provider once a year for a well check visit.  Ask your health care provider how often you should have your eyes and teeth checked.  Stay up to date on all vaccines. This information is not intended to replace advice given to you by your health care provider. Make sure you discuss any questions you have with your health care provider. Document Released: 12/28/2001 Document Revised: 07/13/2018 Document Reviewed: 07/13/2018 Elsevier Patient Education  2020 [employer].  Vitamin D Deficiency Vitamin D deficiency is when your body does not have enough vitamin D. Vitamin D is important to your body for many reasons:  It helps the body absorb two important minerals-calcium and phosphorus.  It plays a role in bone health.  It may help to prevent some diseases, such as diabetes and multiple sclerosis.  It plays a role in muscle function, including heart function. If vitamin D deficiency is severe, it can cause a condition in which your bones become soft. In adults, this  condition is called osteomalacia. In children, this condition is called rickets. What are the causes? This condition may be caused by:  Not eating enough foods that contain vitamin D.  Not getting enough natural sun exposure.  Having certain digestive system diseases that make it difficult for your body to absorb vitamin D. These diseases include Crohn's disease, chronic pancreatitis, and cystic fibrosis.  Having a surgery in which a part of the stomach or a part of the small intestine is removed.  Having chronic kidney disease or liver disease. What increases the risk? You are more likely to develop this condition if you:  Are older.  Do not spend much time outdoors.  Live in a long-term care facility.  Have had broken bones.  Have weak or thin bones (osteoporosis).  Have a disease or condition that changes how the body absorbs vitamin D.  Have dark skin.  Take certain medicines, such as steroid medicines or certain seizure medicines.  Are overweight or obese. What are the signs or symptoms? In mild cases of vitamin D deficiency, there may not be any symptoms. If the condition is severe, symptoms may  include:  Bone pain.  Muscle pain.  Falling often.  Broken bones caused by a minor injury. How is this diagnosed? This condition may be diagnosed with blood tests. Imaging tests such as X-rays may also be done to look for changes in the bone. How is this treated? Treatment for this condition may depend on what caused the condition. Treatment options include:  Taking vitamin D supplements. Your health care provider will suggest what dose is best for you.  Taking a calcium supplement. Your health care provider will suggest what dose is best for you. Follow these instructions at home: Eating and drinking   Eat foods that contain vitamin D. Choices include: ? Fortified dairy products, cereals, or juices. Fortified means that vitamin D has been added to the food. Check  the label on the package to see if the food is fortified. ? Fatty fish, such as salmon or trout. ? Eggs. ? Oysters. ? Mushrooms. The items listed above may not be a complete list of recommended foods and beverages. Contact a dietitian for more information. General instructions  Take medicines and supplements only as told by your health care provider.  Get regular, safe exposure to natural sunlight.  Do not use a tanning bed.  Maintain a healthy weight. Lose weight if needed.  Keep all follow-up visits as told by your health care provider. This is important. How is this prevented? You can get vitamin D by:  Eating foods that naturally contain vitamin D.  Eating or drinking products that have been fortified with vitamin D, such as cereals, juices, and dairy products (including milk).  Taking a vitamin D supplement or a multivitamin supplement that contains vitamin D.  Being in the sun. Your body naturally makes vitamin D when your skin is exposed to sunlight. Your body changes the sunlight into a form of the vitamin that it can use. Contact a health care provider if:  Your symptoms do not go away.  You feel nauseous or you vomit.  You have fewer bowel movements than usual or are constipated. Summary  Vitamin D deficiency is when your body does not have enough vitamin D.  Vitamin D is important to your body for good bone health and muscle function, and it may help prevent some diseases.  Vitamin D deficiency is primarily treated through supplementation. Your health care provider will suggest what dose is best for you.  You can get vitamin D by eating foods that contain vitamin D, by being in the sun, and by taking a vitamin D supplement or a multivitamin supplement that contains vitamin D. This information is not intended to replace advice given to you by your health care provider. Make sure you discuss any questions you have with your health care provider. Document Released:  01/24/2012 Document Revised: 07/10/2018 Document Reviewed: 07/10/2018 Elsevier Patient Education  2020 [employer].

## 2019-06-14 NOTE — Progress Notes (Signed)
Subjective:   Courtney Simpson is a 43 y.o. G75P1 Caucasian female here for a routine well-woman exam.  No LMP recorded. (Menstrual status: IUD).    Current complaints: none PCP: Klien       Does  desire labs to check for menopause and vit d levels  Social History: Sexual: heterosexual Marital Status: married Living situation: with family Occupation: homemaker Tobacco/alcohol: no tobacco use Illicit drugs: no history of illicit drug use  The following portions of the patient's history were reviewed and updated as appropriate: allergies, current medications, past family history, past medical history, past social history, past surgical history and problem list.  Past Medical History Past Medical History:  Diagnosis Date  . Arthritis   . Fatty liver   . GERD (gastroesophageal reflux disease)   . Hyperlipidemia   . Hypertension   . Sarcoidosis     Past Surgical History Past Surgical History:  Procedure Laterality Date  . ANTERIOR CRUCIATE LIGAMENT REPAIR Bilateral   . back surgery    . CHOLECYSTECTOMY    . COLONOSCOPY WITH PROPOFOL N/A 02/23/2016   Procedure: COLONOSCOPY WITH PROPOFOL;  Surgeon: Lollie Sails, MD;  Location[facility] END[facility]COPY;  Service: Endoscopy;  Laterality: N/A;  . ESOPHAGOGASTRODUODENOSCOPY (EGD) WITH PROPOFOL N/A 02/23/2016   Procedure: ESOPHAGOGASTRODUODENOSCOPY (EGD) WITH PROPOFOL;  Surgeon: Lollie Sails, MD;  Location: [facility] ENDOSCOPY;  Service: Endoscopy;  Laterality: N/A;    Gynecologic History G1P1  No LMP recorded. (Menstrual status: IUD). Contraception: IUD Last Pap: 2018. Results were: normal Last mammogram: 2018. Results were: normal   Obstetric History OB History  Gravida Para Term Preterm AB Living  1 1       1   SAB TAB Ectopic Multiple Live Births               # Outcome Date GA Lbr Len/2nd Weight Sex Delivery Anes PTL Lv  1 Para             Obstetric Comments  1st Menstrual Cycle:  10  1st Pregnancy:  24    Current  Medications Current Outpatient Medications on File Prior to Visit  Medication Sig Dispense Refill  . FLUoxetine (PROZAC) 20 MG capsule TAKE ONE CAPSULE BY MOUTH TWICE DAILY 60 capsule 3  . levonorgestrel (MIRENA) 20 MCG/24HR IUD 1 each by Intrauterine route once.    . methotrexate (RHEUMATREX) 2.5 MG tablet Take 7.5 mg by mouth once a week.    . pantoprazole (PROTONIX) 40 MG tablet Take 40 mg by mouth daily.    . sertraline (ZOLOFT) 100 MG tablet Take 100 mg by mouth 2 (two) times daily.    Marland Kitchen topiramate (TOPAMAX) 100 MG tablet Take 100 mg by mouth 2 (two) times daily.    . fluconazole (DIFLUCAN) 100 MG tablet Take 1 tablet (100 mg total) by mouth daily. (Patient not taking: Reported on 06/07/2017) 5 tablet 1  . lisinopril (PRINIVIL,ZESTRIL) 20 MG tablet Take 20 mg by mouth daily.    . Vitamin D, Ergocalciferol, (DRISDOL) 50000 units CAPS capsule Take 1 capsule (50,000 Units total) by mouth 2 (two) times a week. 30 capsule 1   No current facility-administered medications on file prior to visit.     Review of Systems Patient denies any headaches, blurred vision, shortness of breath, chest pain, abdominal pain, problems with bowel movements, urination, or intercourse.  Objective:  BP 121/86   Pulse 88   Ht 5\' 1"  (1.549 m)   Wt 209 lb 11.2 oz (95.1 kg)  BMI 39.62 kg/m  Physical Exam  General:  Well developed, well nourished, no acute distress. She is alert and oriented x3. Skin:  Warm and dry Neck:  Midline trachea, no thyromegaly or nodules Cardiovascular: Regular rate and rhythm, no murmur heard Lungs:  Effort normal, all lung fields clear to auscultation bilaterally Breasts:  No dominant palpable mass, retraction, or nipple discharge Abdomen:  Soft, non tender, no hepatosplenomegaly or masses Pelvic:  External genitalia is normal in appearance.  The vagina is normal in appearance. The cervix is bulbous, no CMT.  Thin prep pap is not done . IUD string noted Uterus is felt to be normal  size, shape, and contour.  No adnexal masses or tenderness noted. Extremities:  No swelling or varicosities noted Psych:  She has a normal mood and affect  Assessment:   Healthy well-woman exam IUD check (placed 2015) Hot flashes BMI 29   Plan:  Labs obtained- will schedule IUD replacement or removal in 6-8 weeks. F/U 1 year for AE, or sooner if needed Mammogram ordered and past due  Lillyth Spong Suzan NailerN Najae Filsaime, CNM

## 2019-06-27 DIAGNOSIS — Z1231 Encounter for screening mammogram for malignant neoplasm of breast: Secondary | ICD-10-CM | POA: Insufficient documentation

## 2019-07-17 NOTE — Telephone Encounter (Signed)
Called pt.

## 2019-07-17 NOTE — Telephone Encounter (Signed)
The patient called and stated that she needs to speak with Amy to go over her results. Pt is requesting a call back. Please advise.

## 2019-08-08 DIAGNOSIS — G8929 Other chronic pain: Secondary | ICD-10-CM

## 2019-08-08 DIAGNOSIS — R519 Headache, unspecified: Secondary | ICD-10-CM

## 2019-08-08 DIAGNOSIS — M542 Cervicalgia: Secondary | ICD-10-CM

## 2019-08-08 DIAGNOSIS — R200 Anesthesia of skin: Secondary | ICD-10-CM

## 2019-08-08 DIAGNOSIS — Z30433 Encounter for removal and reinsertion of intrauterine contraceptive device: Secondary | ICD-10-CM

## 2019-08-08 NOTE — Patient Instructions (Signed)

## 2019-08-08 NOTE — Progress Notes (Signed)
Courtney Simpson is a 43 y.o. year old G30P1 Caucasian female who presents for removal and replacement of a Mirena IUD. She was given informed consent for removal and reinsertion of her Mirena. Her Mirena was placed 2014, No LMP recorded. (Menstrual status: IUD)., and her pregnancy test today was negative.   The risks and benefits of the method and placement have been thouroughly reviewed with the patient and all questions were answered.  Specifically the patient is aware of failure rate of 11/998, expulsion of the IUD and of possible perforation.  The patient is aware of irregular bleeding due to the method and understands the incidence of irregular bleeding diminishes with time.  Signed copy of informed consent in chart.   No LMP recorded. (Menstrual status: IUD). BP 118/90   Pulse 98   Ht 5\' 1"  (1.549 m)   Wt 205 lb 11.2 oz (93.3 kg)   BMI 38.87 kg/m  No results found for this or any previous visit (from the past 24 hour(s)).   Appropriate time out taken. A graves speculum was placed in the vagina.  The cervix was visualized, prepped using Betadine. The strings were visible. They were grasped and the Mirena was easily removed. The cervix was then grasped with a single-tooth tenaculum. The uterus was found to be anteroflexed and it sounded to 8 cm.  Mirena IUD placed per manufacturer's recommendations without complications. The strings were trimmed to 3 cm.  The patient tolerated the procedure well.   The patient was given post procedure instructions, including signs and symptoms of infection and to check for the strings after each menses or each month, and refraining from intercourse or anything in the vagina for 3 days.  She was given a Mirena care card with date Mirena placed, and date Mirena to be removed.    Eliyah Mcshea Rockney Ghee, CNM

## 2019-08-19 DIAGNOSIS — R200 Anesthesia of skin: Secondary | ICD-10-CM | POA: Insufficient documentation

## 2019-08-19 DIAGNOSIS — G8929 Other chronic pain: Secondary | ICD-10-CM

## 2019-08-19 DIAGNOSIS — M542 Cervicalgia: Secondary | ICD-10-CM

## 2019-08-19 DIAGNOSIS — R519 Headache, unspecified: Secondary | ICD-10-CM | POA: Diagnosis present

## 2019-09-17 NOTE — Telephone Encounter (Signed)
Pt called requesting ret call to advise if she needs to keep appt 09/19/19 for IUD check. Pt states she is not having any problems and does not want to come in if it is not necessary.

## 2019-09-18 NOTE — Telephone Encounter (Signed)
Appointment cancelled and patient aware.

## 2019-09-18 NOTE — Telephone Encounter (Signed)
OK to cancel.

## 2020-03-15 HISTORY — PX: GASTRIC BYPASS: SHX52

## 2020-03-18 DIAGNOSIS — R269 Unspecified abnormalities of gait and mobility: Secondary | ICD-10-CM | POA: Diagnosis present

## 2020-03-18 DIAGNOSIS — M25561 Pain in right knee: Secondary | ICD-10-CM | POA: Diagnosis present

## 2020-03-18 DIAGNOSIS — M6281 Muscle weakness (generalized): Secondary | ICD-10-CM | POA: Insufficient documentation

## 2020-03-21 NOTE — Therapy (Addendum)
[place] [facility] [address] [place], [place], [address] Phone: [phone]   Fax:  [phone]  Physical Therapy Evaluation  Patient Details  Name: Courtney Simpson MRN: [id number] Date of Birth: December 18, 1975 Referring Provider (PT): Dr. Gerald Dexter   Encounter Date: 03/18/2020  Treatment: 1 of 9.  Recert date: 06/15/8298 3716 to 53  Past Medical History:  Diagnosis Date  . Arthritis   . Fatty liver   . GERD (gastroesophageal reflux disease)   . Hyperlipidemia   . Hypertension   . Sarcoidosis     Past Surgical History:  Procedure Laterality Date  . ANTERIOR CRUCIATE LIGAMENT REPAIR Bilateral   . back surgery    . CHOLECYSTECTOMY    . COLONOSCOPY WITH PROPOFOL N/A 02/23/2016   Procedure: COLONOSCOPY WITH PROPOFOL;  Surgeon: Lollie Sails, MD;  Location: [facility] ENDOSCOPY;  Service: Endoscopy;  Laterality: N/A;  . ESOPHAGOGASTRODUODENOSCOPY (EGD) WITH PROPOFOL N/A 02/23/2016   Procedure: ESOPHAGOGASTRODUODENOSCOPY (EGD) WITH PROPOFOL;  Surgeon: Lollie Sails, MD;  Location: [facility] ENDOSCOPY;  Service: Endoscopy;  Laterality: N/A;    There were no vitals filed for this visit.       [facility] PT Assessment - 03/23/20 0001      Assessment   Medical Diagnosis  R knee arthroscopy/ Chondromalacia of patellofemoral joint, right.      Referring Provider (PT)  Dr. Gerald Dexter         Pt. s/p R knee arthroscopy (debridement/ shaving of articular cartilage) due to chondroplasty. Pt. known well to PT clinic secondary to L knee surgery/ issues.      See flowsheet  Good incision healing (3 areas on R knee) Minimal swelling No bruising or redness     Objective measurements completed on examination: See above findings.     Nustep L5 10 min. B UE/LE Reviewed HEP  SLS/ tandem stance/ gait    PT Long Term Goals - 03/23/20 0851      PT LONG TERM GOAL #1   Title  Pt. will increase FOTO to 70 to improve pain-free functional mobility.     Baseline  Initial FOTO: 63    Time  4    Period  Weeks    Status  New    Target Date  04/15/20      PT LONG TERM GOAL #2   Title  Pt. will increase R knee AROM to WNL as compared to L knee to improve gait pattern.    Baseline  L knee AROM:  -2 to 126 deg.  R knee: -4 to 118 deg.    Time  4    Period  Weeks    Status  New    Target Date  04/15/20      PT LONG TERM GOAL #3   Title  Pt. will increase R quad/ B hip strength to grossly 5/5 MMT to improve pain-free mobility with step ups/ gait.    Baseline  Decrease strength: B hip flexion/ abduction (4/5 MMT), R quad (4+5 MMT). Pt. currently not particapting with exercise program.    Time  4    Period  Weeks    Status  New    Target Date  04/15/20      PT LONG TERM GOAL #4   Title  Pt. able to ambulate community distance with normalized gait pattern and no c/o knee pain to improve overall mobility.    Baseline  slight R antalgic gait with initial step after standing  Time  4    Period  Weeks    Status  New    Target Date  04/15/20      PT LONG TERM GOAL #5   Title  Pt. able to step up/ down curb with L LE and no c/o pain to improve pain-free mobility.      Baseline  L knee pain with recip. step ups.     Time  4    Period  Weeks    Status  New    Target Date  04/15/20         Plan - 03/23/20 0840    Clinical Impression Statement  Pt. is a pleasant 44 y/o female s/p R knee arthroscopy on 02/19/2020 secondary to chrondromalacia of patellofemoral joint.  Pt. reports no R knee pain at this time and presents with healed incisions.  Pt. presents with good B LE AROM: L knee (-2 to 126 deg.), R knee (-4 to 118 deg.).  Pt. has knee joint line swelling: L 38 cm./ R 39.5 cm).  Slight difficulty with tandem stance/ walking/ SLS due to weakness.  Cuing to prevent lateral leaning during descending stairs secondary to limitations in R knee flexion.  Decrease strength: B hip flexion/ abduction (4/5 MMT), R quad (4+5 MMT).  Pt. currently not  particapting with exercise program.  Slight R antalgic gait with initial steps after standing.  FOTO: initial 63/ goal 70.  Pt. will benefit from skilled PT services to increase R knee ROM/ B LE muscle strength to improve pain-free mobility.    Stability/Clinical Decision Making  Stable/Uncomplicated    Clinical Decision Making  Low    Rehab Potential  Good    PT Frequency  1x / week    PT Duration  4 weeks    PT Treatment/Interventions  ADLs/Self Care Home Management;Aquatic Therapy;Cryotherapy;Electrical Stimulation;Moist Heat;Gait training;Stair training;Functional mobility training;Neuromuscular re-education;Balance training;Therapeutic exercise;Therapeutic activities;Patient/family education;Manual techniques;Passive range of motion;Scar mobilization    PT Next Visit Plan  Progress R quad/ B hip stability.  Issue HEP.    PT Home Exercise Plan  --       Patient will benefit from skilled therapeutic intervention in order to improve the following deficits and impairments:  Abnormal gait, Pain, Improper body mechanics, Decreased scar mobility, Decreased mobility, Decreased activity tolerance, Decreased endurance, Decreased range of motion, Decreased strength, Obesity, Difficulty walking, Impaired flexibility  Visit Diagnosis: Acute pain of right knee  Muscle weakness (generalized)  Gait difficulty     Problem List Patient Active Problem List   Diagnosis Date Noted  . Vitamin D deficiency 11/27/2015  . Gallstones 02/05/2014  . Morbid obesity (HCC) 02/05/2014   Cammie Mcgee, PT, DPT # [phone] 03/23/2020, 8:54 AM  [place] [facility] [address] [place], [place], [address] Phone: [phone]   Fax:  [phone]  Name: Courtney Simpson MRN: [id number] Date of Birth: [birth year]

## 2020-03-23 NOTE — Addendum Note (Signed)
Addended by: Cammie Mcgee on: 03/23/2020 08:58 AM   Modules accepted: Orders

## 2020-03-25 DIAGNOSIS — M6281 Muscle weakness (generalized): Secondary | ICD-10-CM

## 2020-03-25 DIAGNOSIS — M25561 Pain in right knee: Secondary | ICD-10-CM

## 2020-03-25 DIAGNOSIS — R269 Unspecified abnormalities of gait and mobility: Secondary | ICD-10-CM
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
## 2020-03-25 NOTE — Patient Instructions (Signed)
Access Code: [email or web address]: [email or web address]: 05/11/2021Prepared by: Casimiro Needle SherkExercises  Supine Bridge - 1 x daily - 5 x weekly - 2 sets - 10 reps  Supine Quad Set - 1 x daily - 5 x weekly - 2 sets - 10 reps  Supine March - 1 x daily - 5 x weekly - 2 sets - 10 reps  Mini Lunge - 1 x daily - 5 x weekly - 2 sets - 10 reps

## 2020-04-02 DIAGNOSIS — M25561 Pain in right knee: Secondary | ICD-10-CM

## 2020-04-02 DIAGNOSIS — R269 Unspecified abnormalities of gait and mobility: Secondary | ICD-10-CM

## 2020-04-02 DIAGNOSIS — M6281 Muscle weakness (generalized): Secondary | ICD-10-CM
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
## 2020-08-12 DIAGNOSIS — Z1231 Encounter for screening mammogram for malignant neoplasm of breast: Secondary | ICD-10-CM

## 2020-09-03 DIAGNOSIS — Z1231 Encounter for screening mammogram for malignant neoplasm of breast: Secondary | ICD-10-CM | POA: Diagnosis not present

## 2020-09-22 DIAGNOSIS — N951 Menopausal and female climacteric states: Secondary | ICD-10-CM

## 2020-09-22 DIAGNOSIS — R232 Flushing: Secondary | ICD-10-CM

## 2020-09-22 NOTE — Patient Instructions (Signed)
Menopause and Hormone Replacement Therapy Menopause is a normal time of life when menstrual periods stop completely and the ovaries stop producing the female hormones estrogen and progesterone. This lack of hormones can affect your health and cause undesirable symptoms. Hormone replacement therapy (HRT) can relieve some of those symptoms. What is hormone replacement therapy? HRT is the use of artificial (synthetic) hormones to replace hormones that your body has stopped producing because you have reached menopause. What are my options for HRT?  HRT may consist of the synthetic hormones estrogen and progestin, or it may consist of only estrogen (estrogen-only therapy). You and your health care provider will decide which form of HRT is best for you. If you choose to be on HRT and you have a uterus, estrogen and progestin are usually prescribed. Estrogen-only therapy is used for women who do not have a uterus. Possible options for taking HRT include:  Pills.  Patches.  Gels.  Sprays.  Vaginal cream.  Vaginal rings.  Vaginal inserts. The amount of hormone(s) that you take and how long you take the hormone(s) varies according to your health. It is important to:  Begin HRT with the lowest possible dosage.  Stop HRT as soon as your health care provider tells you to stop.  Work with your health care provider so that you feel informed and comfortable with your decisions. What are the benefits of HRT? HRT can reduce the frequency and severity of menopausal symptoms. Benefits of HRT vary according to the kind of symptoms that you have, how severe they are, and your overall health. HRT may help to improve the following symptoms of menopause:  Hot flashes and night sweats. These are sudden feelings of heat that spread over the face and body. The skin may turn red, like a blush. Night sweats are hot flashes that happen while you are sleeping or trying to sleep.  Bone loss (osteoporosis). The  body loses calcium more quickly after menopause, causing the bones to become weaker. This can increase the risk for bone breaks (fractures).  Vaginal dryness. The lining of the vagina can become thin and dry, which can cause pain during sex or cause infection, burning, or itching.  Urinary tract infections.  Urinary incontinence. This is the inability to control when you pass urine.  Irritability.  Short-term memory problems. What are the risks of HRT? Risks of HRT vary depending on your individual health and medical history. Risks of HRT also depend on whether you receive both estrogen and progestin or you receive estrogen only. HRT may increase the risk of:  Spotting. This is when a small amount of blood leaks from the vagina unexpectedly.  Endometrial cancer. This cancer is in the lining of the uterus (endometrium).  Breast cancer.  Increased density of breast tissue. This can make it harder to find breast cancer on a breast X-ray (mammogram).  Stroke.  Heart disease.  Blood clots.  Gallbladder disease.  Liver disease. Risks of HRT can increase if you have any of the following conditions:  Endometrial cancer.  Liver disease.  Heart disease.  Breast cancer.  History of blood clots.  History of stroke. Follow these instructions at home:  Take over-the-counter and prescription medicines only as told by your health care provider.  Get mammograms, pelvic exams, and medical checkups as often as told by your health care provider.  Have Pap tests done as often as told by your health care provider. A Pap test is sometimes called a Pap smear. It   is a screening test that is used to check for signs of cancer of the cervix and vagina. A Pap test can also identify the presence of infection or precancerous changes. Pap tests may be done: ? Every 3 years, starting at age 21. ? Every 5 years, starting after age 30, in combination with testing for human papillomavirus  (HPV). ? More often or less often depending on other medical conditions you have, your age, and other risk factors.  It is up to you to get the results of your Pap test. Ask your health care provider, or the department that is doing the test, when your results will be ready.  Keep all follow-up visits as told by your health care provider. This is important. Contact a health care provider if you have:  Pain or swelling in your legs.  Shortness of breath.  Chest pain.  Lumps or changes in your breasts or armpits.  Slurred speech.  Pain, burning, or bleeding when you urinate.  Unusual vaginal bleeding.  Dizziness or headaches.  Weakness or numbness in any part of your arms or legs.  Pain in your abdomen. Summary  Menopause is a normal time of life when menstrual periods stop completely and the ovaries stop producing the female hormones estrogen and progesterone.  Hormone replacement therapy (HRT) can relieve some of the symptoms of menopause.  HRT can reduce the frequency and severity of menopausal symptoms.  Risks of HRT vary depending on your individual health and medical history. This information is not intended to replace advice given to you by your health care provider. Make sure you discuss any questions you have with your health care provider. Document Revised: 07/04/2018 Document Reviewed: 07/04/2018 Elsevier Patient Education  2020 Elsevier Inc.  

## 2020-09-22 NOTE — Progress Notes (Signed)
OB/GYN CONFERENCE NOTE:  Subjective:       Courtney Simpson is a 44 y.o. G76P1001 female who presents for a conference appointment. Current complaints include: hot flashes. Pt states she has started to experience hot flashes that start at her feet and work upward. This has been bothersome for her and has affected her daily life as she does not know when it will come on. She state she has also experienced some moodiness.    Gynecologic History No LMP recorded (lmp unknown). (Menstrual status: IUD). Contraception: IUD  Obstetric History OB History  Gravida Para Term Preterm AB Living  1 1 1     1   SAB TAB Ectopic Multiple Live Births          1    # Outcome Date GA Lbr Len/2nd Weight Sex Delivery Anes PTL Lv  1 Term 07/06/00   7 lb 12 oz (3.515 kg) M Vag-Spont  N LIV    Obstetric Comments  1st Menstrual Cycle:  10  1st Pregnancy:  24    Past Medical History:  Diagnosis Date  . Arthritis   . Fatty liver   . GERD (gastroesophageal reflux disease)   . Hyperlipidemia   . Hypertension   . Sarcoidosis     Past Surgical History:  Procedure Laterality Date  . ANTERIOR CRUCIATE LIGAMENT REPAIR Bilateral   . back surgery    . CHOLECYSTECTOMY    . COLONOSCOPY WITH PROPOFOL N/A 02/23/2016   Procedure: COLONOSCOPY WITH PROPOFOL;  Surgeon: 04/24/2016, MD;  Location: [facility] ENDOSCOPY;  Service: Endoscopy;  Laterality: N/A;  . ESOPHAGOGASTRODUODENOSCOPY (EGD) WITH PROPOFOL N/A 02/23/2016   Procedure: ESOPHAGOGASTRODUODENOSCOPY (EGD) WITH PROPOFOL;  Surgeon: 04/24/2016, MD;  Location: [facility] ENDOSCOPY;  Service: Endoscopy;  Laterality: N/A;  . GASTRIC BYPASS  03/2020    Current Outpatient Medications on File Prior to Visit  Medication Sig Dispense Refill  . ARIPiprazole (ABILIFY) 5 MG tablet Take 5 mg by mouth daily.    . cyanocobalamin (,VITAMIN B-12,) 1000 MCG/ML injection INJECT 1 ML (1000 MCG) INTO THE MUSCLE MONTHLY    . FLUoxetine (PROZAC) 20 MG capsule TAKE ONE CAPSULE  BY MOUTH TWICE DAILY 60 capsule 3  . folic acid (FOLVITE) 1 MG tablet Take 1 mg by mouth daily.    04/2020 levonorgestrel (MIRENA) 20 MCG/24HR IUD 1 each by Intrauterine route once.    . methotrexate (RHEUMATREX) 2.5 MG tablet Take 7.5 mg by mouth once a week.    . methotrexate 250 MG/10ML injection Inject into the skin.    . NYSTATIN powder SMARTSIG:1 Application Topical 2-3 Times Daily    . pantoprazole (PROTONIX) 40 MG tablet Take 40 mg by mouth daily.    . sertraline (ZOLOFT) 100 MG tablet Take 100 mg by mouth 2 (two) times daily.    . sucralfate (CARAFATE) 1 g tablet SMARTSIG:1 By Mouth 4-5 Times Daily    . venlafaxine XR (EFFEXOR-XR) 150 MG 24 hr capsule Take 150 mg by mouth daily.    . Vitamin D, Ergocalciferol, (DRISDOL) 50000 units CAPS capsule Take 1 capsule (50,000 Units total) by mouth 2 (two) times a week. 30 capsule 1   No current facility-administered medications on file prior to visit.    Allergies  Allergen Reactions  . Eggs Or Egg-Derived Products Rash    Egg whites  . Other Anaphylaxis    Nuts  . Adhesive [Tape] Dermatitis    Blisters  . Septra [Sulfamethoxazole-Trimethoprim] Rash  . Sulfa Antibiotics Rash  .  Vancomycin Rash    Social History   Socioeconomic History  . Marital status: Married    Spouse name: Not on file  . Number of children: Not on file  . Years of education: Not on file  . Highest education level: Not on file  Occupational History  . Not on file  Tobacco Use  . Smoking status: Never Smoker  . Smokeless tobacco: Never Used  Vaping Use  . Vaping Use: Never used  Substance and Sexual Activity  . Alcohol use: No  . Drug use: No  . Sexual activity: Yes    Birth control/protection: I.U.D.    Comment: mirena  Other Topics Concern  . Not on file  Social History Narrative  . Not on file   Social Determinants of Health   Financial Resource Strain:   . Difficulty of Paying Living Expenses: Not on file  Food Insecurity:   . Worried About  Programme researcher, broadcasting/film/video in the Last Year: Not on file  . Ran Out of Food in the Last Year: Not on file  Transportation Needs:   . Lack of Transportation (Medical): Not on file  . Lack of Transportation (Non-Medical): Not on file  Physical Activity:   . Days of Exercise per Week: Not on file  . Minutes of Exercise per Session: Not on file  Stress:   . Feeling of Stress : Not on file  Social Connections:   . Frequency of Communication with Friends and Family: Not on file  . Frequency of Social Gatherings with Friends and Family: Not on file  . Attends Religious Services: Not on file  . Active Member of Clubs or Organizations: Not on file  . Attends Banker Meetings: Not on file  . Marital Status: Not on file  Intimate Partner Violence:   . Fear of Current or Ex-Partner: Not on file  . Emotionally Abused: Not on file  . Physically Abused: Not on file  . Sexually Abused: Not on file    Family History  Problem Relation Age of Onset  . Cancer Paternal Grandmother        colon  . Cancer Paternal Grandfather        colon  . Breast cancer Neg Hx     The following portions of the patient's history were reviewed and updated as appropriate: allergies, current medications, past family history, past medical history, past social history, past surgical history and problem list.  Review of Systems Pertinent items are noted in HPI.   Objective:   BP 125/87   Pulse 75   Ht 5\' 1"  (1.549 m)   Wt 205 lb (93 kg)   LMP  (LMP Unknown)   BMI 38.73 kg/m    Assessment/Plan:   Patient Active Problem List   Diagnosis Date Noted  . Vitamin D deficiency 11/27/2015  . Gallstones 02/05/2014  . Morbid obesity ([facility]) 02/05/2014       Time: 15 min spent discussing menopause, signs and symptoms, diagnosis, the changes that it can cause on the body. Discussed self help measures including diet, exercise, herbal supplements . Discussed medication treatment options including hormonal and non  hormonal options. Discussed risks given her history of hypertension and hyperlipidemia. Pt state she will talk to her psychiatrist about changing medications to Paxil, Lexapro to help with hot flashes. She will try self help measures first.   Return to Clinic: for annual exam or PRN.   02/07/2014, CNM [facility]

## 2020-10-31 DIAGNOSIS — H547 Unspecified visual loss: Secondary | ICD-10-CM | POA: Diagnosis present

## 2020-10-31 DIAGNOSIS — Z20822 Contact with and (suspected) exposure to covid-19: Secondary | ICD-10-CM | POA: Diagnosis present

## 2020-10-31 DIAGNOSIS — R531 Weakness: Secondary | ICD-10-CM | POA: Diagnosis not present

## 2020-10-31 DIAGNOSIS — R2990 Unspecified symptoms and signs involving the nervous system: Secondary | ICD-10-CM

## 2020-10-31 DIAGNOSIS — F419 Anxiety disorder, unspecified: Secondary | ICD-10-CM | POA: Diagnosis present

## 2020-10-31 DIAGNOSIS — D8683 Sarcoid iridocyclitis: Secondary | ICD-10-CM | POA: Diagnosis present

## 2020-10-31 DIAGNOSIS — I639 Cerebral infarction, unspecified: Secondary | ICD-10-CM

## 2020-10-31 DIAGNOSIS — Z8673 Personal history of transient ischemic attack (TIA), and cerebral infarction without residual deficits: Secondary | ICD-10-CM

## 2020-10-31 DIAGNOSIS — K2960 Other gastritis without bleeding: Secondary | ICD-10-CM | POA: Diagnosis present

## 2020-10-31 DIAGNOSIS — Z888 Allergy status to other drugs, medicaments and biological substances status: Secondary | ICD-10-CM

## 2020-10-31 DIAGNOSIS — Z7952 Long term (current) use of systemic steroids: Secondary | ICD-10-CM

## 2020-10-31 DIAGNOSIS — D869 Sarcoidosis, unspecified: Secondary | ICD-10-CM | POA: Diagnosis not present

## 2020-10-31 DIAGNOSIS — Z91018 Allergy to other foods: Secondary | ICD-10-CM

## 2020-10-31 DIAGNOSIS — F32A Depression, unspecified: Secondary | ICD-10-CM | POA: Diagnosis present

## 2020-10-31 DIAGNOSIS — K760 Fatty (change of) liver, not elsewhere classified: Secondary | ICD-10-CM | POA: Diagnosis present

## 2020-10-31 DIAGNOSIS — Z9049 Acquired absence of other specified parts of digestive tract: Secondary | ICD-10-CM

## 2020-10-31 DIAGNOSIS — G4459 Other complicated headache syndrome: Secondary | ICD-10-CM | POA: Diagnosis not present

## 2020-10-31 DIAGNOSIS — E785 Hyperlipidemia, unspecified: Secondary | ICD-10-CM | POA: Diagnosis present

## 2020-10-31 DIAGNOSIS — Z9884 Bariatric surgery status: Secondary | ICD-10-CM

## 2020-10-31 DIAGNOSIS — H209 Unspecified iridocyclitis: Secondary | ICD-10-CM

## 2020-10-31 DIAGNOSIS — M1990 Unspecified osteoarthritis, unspecified site: Secondary | ICD-10-CM | POA: Diagnosis present

## 2020-10-31 DIAGNOSIS — Z809 Family history of malignant neoplasm, unspecified: Secondary | ICD-10-CM

## 2020-10-31 DIAGNOSIS — D849 Immunodeficiency, unspecified: Secondary | ICD-10-CM | POA: Diagnosis present

## 2020-10-31 DIAGNOSIS — K219 Gastro-esophageal reflux disease without esophagitis: Secondary | ICD-10-CM | POA: Diagnosis present

## 2020-10-31 DIAGNOSIS — F3341 Major depressive disorder, recurrent, in partial remission: Secondary | ICD-10-CM | POA: Diagnosis not present

## 2020-10-31 DIAGNOSIS — K5790 Diverticulosis of intestine, part unspecified, without perforation or abscess without bleeding: Secondary | ICD-10-CM | POA: Diagnosis present

## 2020-10-31 DIAGNOSIS — Z79899 Other long term (current) drug therapy: Secondary | ICD-10-CM

## 2020-10-31 DIAGNOSIS — I10 Essential (primary) hypertension: Secondary | ICD-10-CM | POA: Diagnosis present

## 2020-10-31 DIAGNOSIS — R918 Other nonspecific abnormal finding of lung field: Secondary | ICD-10-CM | POA: Diagnosis present

## 2020-10-31 DIAGNOSIS — Z882 Allergy status to sulfonamides status: Secondary | ICD-10-CM

## 2020-10-31 DIAGNOSIS — Z8711 Personal history of peptic ulcer disease: Secondary | ICD-10-CM

## 2020-10-31 DIAGNOSIS — H518 Other specified disorders of binocular movement: Secondary | ICD-10-CM | POA: Diagnosis present

## 2020-10-31 DIAGNOSIS — H53461 Homonymous bilateral field defects, right side: Secondary | ICD-10-CM | POA: Diagnosis present

## 2020-10-31 DIAGNOSIS — E669 Obesity, unspecified: Secondary | ICD-10-CM

## 2020-10-31 DIAGNOSIS — D860 Sarcoidosis of lung: Secondary | ICD-10-CM | POA: Diagnosis present

## 2020-10-31 DIAGNOSIS — G4730 Sleep apnea, unspecified: Secondary | ICD-10-CM | POA: Diagnosis present

## 2020-10-31 DIAGNOSIS — Z91012 Allergy to eggs: Secondary | ICD-10-CM

## 2020-10-31 DIAGNOSIS — R569 Unspecified convulsions: Secondary | ICD-10-CM | POA: Diagnosis present

## 2020-10-31 DIAGNOSIS — H93A9 Pulsatile tinnitus, unspecified ear: Secondary | ICD-10-CM | POA: Diagnosis present

## 2020-10-31 DIAGNOSIS — E538 Deficiency of other specified B group vitamins: Secondary | ICD-10-CM | POA: Diagnosis present

## 2020-10-31 DIAGNOSIS — I6529 Occlusion and stenosis of unspecified carotid artery: Secondary | ICD-10-CM | POA: Diagnosis present

## 2020-10-31 MED ORDER — NICARDIPINE HCL 20 MG PO CAPS
20.0000 mg | ORAL_CAPSULE | ORAL | Status: AC | PRN
Start: 2020-10-31 — End: 2020-11-01

## 2020-10-31 NOTE — H&P (Signed)
History and Physical   Courtney DAUGHTRY XBJ:[id number] DOB: 11-11-1976 DOA: 10/31/2020  PCP: Lynnea Ferrier, MD  Outpatient Specialists: Dr. Wynema Birch, [facility] Patient coming from: Drs. Office  I have personally briefly reviewed patient's old medical records in [facility] EMR.  Chief Concern: Vision changes, dizziness, left-sided sensation loss  HPI: Courtney Simpson is a 44 y.o. female with medical history significant for sarcoidosis involving the lung diagnosed in 2008 and associated uveitis, B12 deficiency, status post bariatric surgery - bypass per patient, history of marginal ulcer, erosive gastritis 02/23/2016, depression/anxiety, h/o pseudoseizure hospitalized at [facility], diverticulosis, obesity, sleep apnea in 2007 and does not wear a cpap mask anymore, presented to the ED for chief concern of headache, dizziness, feeling faint, and tingling of her left extremities.   She reports her last known normal was approximately 1300 on day of presentation. She was sitting in recliner with pet puppy when she experienced her head hurting, posterior left side, sharp pain that became dull, lasting about 30 minutes, went away for two hours, resuming at approximately 1404 about 30-45 minutes, at it's peak the headache was an 8/10 and now has improved to 7/10. Then she drove herself to an appointment with Dr. Graciela Husbands, for follow-up of upper respiratory that was not improving after 12 days.   She reports nothing made it better, however when she was at Dr. Odessa Fleming office, staff asked her to lay flat and this made the headache worse. She reports a similar headache before a few years ago.   ROS: She endorses blurry and dizziness at 1 PM and then double vision, dizziness worsened the second time at 3 pm. She endorses feeling upset stomach. She denies new shortness of breath. She denies chest pain, back pain, dysuria, hematuria, diarrhea. She endorsed blood in her stool today. She endorses  associated left upper and lower extremity tingling that started approximately at 3 pm. She endorses the tingling has improved. She denies urinary or bowel incontinence, nausea, vomiting, dysphagia, odynophagia, hearing changes, tinnitus.   Social history: lives with spouse. She is disabled and formerly worked as a Architectural technologist. She formerly vapes nicotine and has not used in 1 month. She endorses infrequent etoh drinks and her last drink was in May 2021. She denies recreational drug use.   Sx history: gastric bypass (May 2021) and cholecystectomy  ED Course: Discussed with ED provider, requesting admission for strokelike symptoms.  Review of Systems: As per HPI otherwise 10 point review of systems negative.  Assessment/Plan  Principal Problem:   Stroke [facility]) Active Problems:   Morbid obesity (HCC)   Sarcoidosis of lung (HCC)   Sarcoidosis   Uveitis   Obesity (BMI 30-39.9)   Depression   Sleep apnea in adult   Stroke work-up -differentials include TIA, neurosarcoid with basal involvement, brainstem stroke, left occipital stroke -Neurology has been consulted -2D echo, MRI of the brain with and without contrast, telemetry, frequent neuro checks -CT of the head without contrast was read as probable early infarction of the left occipital lobe.  No hemorrhage. -CTA of the head and neck and CT cerebral perfusion with contrast have been completed and pending read -Aspirin 325 for now and daily, Lipitor 80 mg daily started -Permissive hypertension allowing Sbp treat if greater than 220 -PT, OT, speech therapy -N.p.o. until cleared by bedside swallow study -Patient will need outpatient ophthalmological follow-up per neurology recommendation  Permissive hypertension-nicardipine 20 mg p.o. every 4 hours as needed for SBP greater than  220  Sarcoidosis with involvement of the lungs and uveitis-patient is on methotrexate 25mg /mL injection subcutaneous weekly for sarcoid uveitis of both  eyes  Depression/anxiety-venlafaxine 150 mg XR capsule daily with breakfast, fluoxetine 40 mg capsules daily, Abilify 5 mg daily, sertraline 100 mg BID  Sleep apnea-patient not wearing CPAP, extensive discussion with patient regarding the importance of wearing CPAP machine  GERD - protonix 40 mg daily   Chart reviewed.   DVT prophylaxis: scds Code Status: full code Diet: npo, pending swallow study Family Communication: updated mother at bedside Disposition Plan: pending clinical course Consults called: neurology Admission status: observation  Past Medical History:  Diagnosis Date  . Arthritis   . Fatty liver   . GERD (gastroesophageal reflux disease)   . Hyperlipidemia   . Hypertension   . Sarcoidosis    Past Surgical History:  Procedure Laterality Date  . ANTERIOR CRUCIATE LIGAMENT REPAIR Bilateral   . back surgery    . CHOLECYSTECTOMY    . COLONOSCOPY WITH PROPOFOL N/A 02/23/2016   Procedure: COLONOSCOPY WITH PROPOFOL;  Surgeon: Christena Deem, MD;  Location: [facility] ENDOSCOPY;  Service: Endoscopy;  Laterality: N/A;  . ESOPHAGOGASTRODUODENOSCOPY (EGD) WITH PROPOFOL N/A 02/23/2016   Procedure: ESOPHAGOGASTRODUODENOSCOPY (EGD) WITH PROPOFOL;  Surgeon: Christena Deem, MD;  Location[facility] ENDOSCOPY;  Service: Endoscopy;  Laterality: N/A;  . GASTRIC BYPASS  03/2020   Social History:  reports that she has never smoked. She has never used smokeless tobacco. She reports that she does not drink alcohol and does not use drugs.  Allergies  Allergen Reactions  . Eggs Or Egg-Derived Products Rash    Egg whites  . Other Anaphylaxis    Nuts  . Adhesive [Tape] Dermatitis    Blisters  . Septra [Sulfamethoxazole-Trimethoprim] Rash  . Sulfa Antibiotics Rash  . Vancomycin Rash   Family History  Problem Relation Age of Onset  . Cancer Paternal Grandmother        colon  . Cancer Paternal Grandfather        colon  . Breast cancer Neg Hx    Family history: Family history  reviewed and not pertinent  Prior to Admission medications   Medication Sig Start Date End Date Taking? Authorizing Provider  azithromycin (ZITHROMAX) 250 MG tablet Take 250-500 mg by mouth daily. 10/27/20 11/01/20 Yes [provider]  predniSONE (DELTASONE) 10 MG tablet Take 10-40 mg by mouth daily. 10/27/20  Yes [provider]  ARIPiprazole (ABILIFY) 5 MG tablet Take 5 mg by mouth daily. 06/28/20   [provider]  citalopram (CELEXA) 20 MG tablet Take 20 mg by mouth daily. 10/25/20   [provider]  cyanocobalamin (,VITAMIN B-12,) 1000 MCG/ML injection INJECT 1 ML (1000 MCG) INTO THE MUSCLE MONTHLY 12/23/17   [provider]  FLUoxetine (PROZAC) 40 MG capsule Take 40 mg by mouth daily. 09/24/20   [provider]  folic acid (FOLVITE) 1 MG tablet Take 1 mg by mouth daily. 07/02/20   [provider]  levonorgestrel (MIRENA) 20 MCG/24HR IUD 1 each by Intrauterine route once.    [provider]  methotrexate (RHEUMATREX) 2.5 MG tablet Take 7.5 mg by mouth once a week.    [provider]  methotrexate 250 MG/10ML injection Inject into the skin. 02/05/20   [provider]  NYSTATIN powder SMARTSIG:1 Application Topical 2-3 Times Daily 09/19/20   [provider]  pantoprazole (PROTONIX) 40 MG tablet Take 40 mg by mouth daily.    [provider]  sertraline (ZOLOFT) 100 MG tablet Take 100 mg by mouth 2 (two) times daily.    [provider]  sucralfate (CARAFATE) 1 g tablet SMARTSIG:1 By Mouth 4-5 Times Daily 09/19/20   [provider]  topiramate (TOPAMAX) 100 MG tablet Take 100 mg by mouth 2 (two) times daily. 10/13/20   [provider]  venlafaxine XR (EFFEXOR-XR) 150 MG 24 hr capsule Take 150 mg by mouth daily. 09/09/20   [provider]  Vitamin D, Ergocalciferol, (DRISDOL) 50000 units CAPS capsule Take 1 capsule (50,000 Units total) by mouth 2 (two) times a  week. 11/27/15   Purcell Nails, CNM   Physical Exam: Vitals:   10/31/20 1658 10/31/20 1722 10/31/20 1724  BP: (!) 155/105 (!) 159/99   Pulse: 86 88   Resp: 16 15   Temp: 98.3 F (36.8 C) 98 F (36.7 C)   TempSrc: Oral Oral   SpO2: 99% 99%   Weight:   94.5 kg  Height:   5\' 4"  (1.626 m)   Constitutional: appears older than chronological age, NAD, calm, comfortable Eyes: PERRL, lids and conjunctivae normal ENMT: Mucous membranes are moist. Posterior pharynx clear of any exudate or lesions. Age-appropriate dentition. Hearing appropriate Neck: normal, supple, no masses, no thyromegaly Respiratory: clear to auscultation bilaterally, no wheezing, no crackles. Normal respiratory effort. No accessory muscle use.  Cardiovascular: Regular rate and rhythm, no murmurs / rubs / gallops. No extremity edema. 2+ pedal pulses. No carotid bruits.  Abdomen: obese abdomen, several well healed scars consistent with laparoscopic procedure, no tenderness, no masses palpated, no hepatosplenomegaly. Bowel sounds positive.  Musculoskeletal: no clubbing / cyanosis. No joint deformity upper and lower extremities. Good ROM, no contractures, no atrophy. Normal muscle tone.  Skin: no rashes, lesions, ulcers. No induration Neurologic: Sensation intact. Strength 5/5 in all 4.  Psychiatric: Normal judgment and insight. Alert and oriented x 3. Normal mood.   EKG: Personally reviewed sinus rhythm with rate of 79, Qtc 543  CT of the head without contrast on Admission: Personally reviewed and I agree with radiologist reading as below.  CT Code Stroke CTA Head W/WO contrast  Result Date: 10/31/2020 CLINICAL DATA:  Right visual field defect. Occipital stroke shown by head CT. EXAM: CT ANGIOGRAPHY HEAD AND NECK CT PERFUSION BRAIN TECHNIQUE: Multidetector CT imaging of the head and neck was performed using the standard protocol during bolus administration of intravenous contrast. Multiplanar CT image reconstructions and  MIPs were obtained to evaluate the vascular anatomy. Carotid stenosis measurements (when applicable) are obtained utilizing NASCET criteria, using the distal internal carotid diameter as the denominator. Multiphase CT imaging of the brain was performed following IV bolus contrast injection. Subsequent parametric perfusion maps were calculated using RAPID software. CONTRAST:  OMNIPAQUE IOHEXOL 350 MG/ML SOLN COMPARISON:  Head CT earlier same day FINDINGS: CT HEAD FINDINGS CTA NECK FINDINGS Aortic arch: Normal.  No atherosclerotic change. Right carotid system: Normal.  No atherosclerotic change. Left carotid system: Common carotid artery widely patent. There is some soft and calcified plaque at the proximal ICA bulb on the left but without measurable stenosis. Cervical ICA widely patent beyond that. Vertebral arteries: Both vertebral artery origins are widely patent. Both vertebral arteries appear normal through the cervical region to the foramen magnum. Skeleton: Previous ACDF. Other neck: No neck mass or lymphadenopathy.  Normal Upper chest: Normal Review of the MIP images confirms the above findings CTA HEAD FINDINGS Anterior circulation: Both internal carotid arteries widely patent through the skull base and  siphon regions. The anterior and middle cerebral vessels are normal without large or medium vessel occlusion or proximal stenosis. Posterior circulation: Both vertebral arteries widely patent to the basilar. No basilar stenosis. Posterior circulation branch vessels appear normal. This includes the left PCA which appears presently normal. I think there is a tiny posterior communicating artery on each side. Venous sinuses: Patent and normal. Anatomic variants: None significant. Review of the MIP images confirms the above findings CT Brain Perfusion Findings: ASPECTS: 10 CBF (<30%) Volume: 0mL Perfusion (Tmax>6.0s) volume: 0mL Mismatch Volume: 0mL Infarction Location:None by perfusion.  Left occipital by  CT. IMPRESSION: 1. Negative acute evaluation. No large or medium vessel occlusion. Normal perfusion study. Left occipital stroke is probably subacute, with recanalization of the left PCA and pseudo normalization of flow and perfusion parameters at this time. 2. Mild atherosclerotic disease at the left ICA bulb but without measurable stenosis. Electronically Signed   By: Paulina Fusi M.D.   On: 10/31/2020 19:00   CT Code Stroke CTA Neck W/WO contrast  Result Date: 10/31/2020 CLINICAL DATA:  Right visual field defect. Occipital stroke shown by head CT. EXAM: CT ANGIOGRAPHY HEAD AND NECK CT PERFUSION BRAIN TECHNIQUE: Multidetector CT imaging of the head and neck was performed using the standard protocol during bolus administration of intravenous contrast. Multiplanar CT image reconstructions and MIPs were obtained to evaluate the vascular anatomy. Carotid stenosis measurements (when applicable) are obtained utilizing NASCET criteria, using the distal internal carotid diameter as the denominator. Multiphase CT imaging of the brain was performed following IV bolus contrast injection. Subsequent parametric perfusion maps were calculated using RAPID software. CONTRAST:  OMNIPAQUE IOHEXOL 350 MG/ML SOLN COMPARISON:  Head CT earlier same day FINDINGS: CT HEAD FINDINGS CTA NECK FINDINGS Aortic arch: Normal.  No atherosclerotic change. Right carotid system: Normal.  No atherosclerotic change. Left carotid system: Common carotid artery widely patent. There is some soft and calcified plaque at the proximal ICA bulb on the left but without measurable stenosis. Cervical ICA widely patent beyond that. Vertebral arteries: Both vertebral artery origins are widely patent. Both vertebral arteries appear normal through the cervical region to the foramen magnum. Skeleton: Previous ACDF. Other neck: No neck mass or lymphadenopathy.  Normal Upper chest: Normal Review of the MIP images confirms the above findings CTA HEAD  FINDINGS Anterior circulation: Both internal carotid arteries widely patent through the skull base and siphon regions. The anterior and middle cerebral vessels are normal without large or medium vessel occlusion or proximal stenosis. Posterior circulation: Both vertebral arteries widely patent to the basilar. No basilar stenosis. Posterior circulation branch vessels appear normal. This includes the left PCA which appears presently normal. I think there is a tiny posterior communicating artery on each side. Venous sinuses: Patent and normal. Anatomic variants: None significant. Review of the MIP images confirms the above findings CT Brain Perfusion Findings: ASPECTS: 10 CBF (<30%) Volume: 0mL Perfusion (Tmax>6.0s) volume: 0mL Mismatch Volume: 0mL Infarction Location:None by perfusion.  Left occipital by CT. IMPRESSION: 1. Negative acute evaluation. No large or medium vessel occlusion. Normal perfusion study. Left occipital stroke is probably subacute, with recanalization of the left PCA and pseudo normalization of flow and perfusion parameters at this time. 2. Mild atherosclerotic disease at the left ICA bulb but without measurable stenosis. Electronically Signed   By: Paulina Fusi M.D.   On: 10/31/2020 19:00   CT Code Stroke Cerebral Perfusion with contrast  Result Date: 10/31/2020 CLINICAL DATA:  Right visual field defect. Occipital stroke  shown by head CT. EXAM: CT ANGIOGRAPHY HEAD AND NECK CT PERFUSION BRAIN TECHNIQUE: Multidetector CT imaging of the head and neck was performed using the standard protocol during bolus administration of intravenous contrast. Multiplanar CT image reconstructions and MIPs were obtained to evaluate the vascular anatomy. Carotid stenosis measurements (when applicable) are obtained utilizing NASCET criteria, using the distal internal carotid diameter as the denominator. Multiphase CT imaging of the brain was performed following IV bolus contrast injection. Subsequent parametric  perfusion maps were calculated using RAPID software. CONTRAST:  OMNIPAQUE IOHEXOL 350 MG/ML SOLN COMPARISON:  Head CT earlier same day FINDINGS: CT HEAD FINDINGS CTA NECK FINDINGS Aortic arch: Normal.  No atherosclerotic change. Right carotid system: Normal.  No atherosclerotic change. Left carotid system: Common carotid artery widely patent. There is some soft and calcified plaque at the proximal ICA bulb on the left but without measurable stenosis. Cervical ICA widely patent beyond that. Vertebral arteries: Both vertebral artery origins are widely patent. Both vertebral arteries appear normal through the cervical region to the foramen magnum. Skeleton: Previous ACDF. Other neck: No neck mass or lymphadenopathy.  Normal Upper chest: Normal Review of the MIP images confirms the above findings CTA HEAD FINDINGS Anterior circulation: Both internal carotid arteries widely patent through the skull base and siphon regions. The anterior and middle cerebral vessels are normal without large or medium vessel occlusion or proximal stenosis. Posterior circulation: Both vertebral arteries widely patent to the basilar. No basilar stenosis. Posterior circulation branch vessels appear normal. This includes the left PCA which appears presently normal. I think there is a tiny posterior communicating artery on each side. Venous sinuses: Patent and normal. Anatomic variants: None significant. Review of the MIP images confirms the above findings CT Brain Perfusion Findings: ASPECTS: 10 CBF (<30%) Volume: 0mL Perfusion (Tmax>6.0s) volume: 0mL Mismatch Volume: 0mL Infarction Location:None by perfusion.  Left occipital by CT. IMPRESSION: 1. Negative acute evaluation. No large or medium vessel occlusion. Normal perfusion study. Left occipital stroke is probably subacute, with recanalization of the left PCA and pseudo normalization of flow and perfusion parameters at this time. 2. Mild atherosclerotic disease at the left ICA bulb but  without measurable stenosis. Electronically Signed   By: Paulina Fusi M.D.   On: 10/31/2020 19:00   CT HEAD CODE STROKE WO CONTRAST  Result Date: 10/31/2020 CLINICAL DATA:  Code stroke. Near syncope. Blurred vision. Visual field defect right upper. EXAM: CT HEAD WITHOUT CONTRAST TECHNIQUE: Contiguous axial images were obtained from the base of the skull through the vertex without intravenous contrast. COMPARISON:  06/14/2016 FINDINGS: Brain: Brainstem and cerebellum are normal. Probable early infarction in the left occipital lobe. The remainder the brain is normal. No sign of mass, hemorrhage, hydrocephalus or extra-axial collection. Vascular: Normal Skull: Normal Sinuses/Orbits: Normal Other: None ASPECTS (Alberta Stroke Program Early CT Score) - Ganglionic level infarction (caudate, lentiform nuclei, internal capsule, insula, M1-M3 cortex): 7 - Supraganglionic infarction (M4-M6 cortex): 3 Total score (0-10 with 10 being normal): 10 IMPRESSION: 1. Probable early infarction in the left occipital lobe. No hemorrhage. 2. ASPECTS is 10. 3. These results were called by telephone at the time of interpretation on 10/31/2020 at 5:15 pm to provider Willy Eddy , who verbally acknowledged these results. Electronically Signed   By: Paulina Fusi M.D.   On: 10/31/2020 17:16   Labs on Admission: I have personally reviewed following labs  CBC: Recent Labs  Lab 10/31/20 1753  WBC 11.9*  NEUTROABS 7.9*  HGB 11.9*  HCT  37.6  MCV 84.9  PLT 437*   Basic Metabolic Panel: Recent Labs  Lab 10/31/20 1753  NA 139  K 3.2*  CL 101  CO2 27  GLUCOSE 96  BUN <5*  CREATININE 0.66  CALCIUM 8.4*   GFR: Estimated Creatinine Clearance: 97.9 mL/min (by C-G formula based on SCr of 0.66 mg/dL).  Liver Function Tests: Recent Labs  Lab 10/31/20 1753  AST 24  ALT 16  ALKPHOS 125  BILITOT 0.5  PROT 7.5  ALBUMIN 3.6   Coagulation Profile: Recent Labs  Lab 10/31/20 1753  INR 1.0   CBG: Recent Labs   Lab 10/31/20 1718  GLUCAP 88   Urine analysis:    Component Value Date/Time   BILIRUBINUR neg 11/20/2015 1514   PROTEINUR neg 11/20/2015 1514   UROBILINOGEN 0.2 11/20/2015 1514   NITRITE neg 11/20/2015 1514   LEUKOCYTESUR Negative 11/20/2015 1514   Ifeoma Vallin N Cynda Soule D.O. [facility]  If 7AM-7PM, please contact day coverage provider [email or web address]  10/31/2020, 8:25 PM

## 2020-10-31 NOTE — ED Notes (Signed)
Pt presents to ED with c/o of having a R visual cut, double vision and weakness and sensation changes to L arm and face. Pt states this started today around 1600 today when going to see her PCP due to not feeling well for the past few week.s Pt has no pronator drift at this time. Pt has weak bilateral equal grips. Pt is A&Ox4 at this time.

## 2020-10-31 NOTE — ED Triage Notes (Signed)
PT to ED from Dr's office. PT was being seen for sick visit but began to feel like she was going to pass out and began seeing double.  PT has visual field deficit to R upper field of vision and decreased sensation in left leg and continues to endorse double vision. Speech clear, pt oriented. Smile equal, grips equal.

## 2020-10-31 NOTE — Consult Note (Signed)
[facility] Telemedicine Consult  Requesting Provider: Dr. Fanny BienQuale Consult Participants: Dr. Marthe PatchA. Dayvon Dax, Telespecialist RN - Ana   Bedside RN-Jackie Location of the provider: Home [facility] of the [facility]  This consult was provided via telemedicine with 2-way video and audio communication. The patient/family was informed that care would be provided in this way and agreed to receive care in this manner.   Chief Complaint: Dizziness, blurred vision, double vision generalized weakness  HPI:  44/F with PMH of HTN, HLD, sarcoidosis, who has been sick with non specific symptoms of malaise, dizziness, was at her doctor's office to get checked out when she noted sudden onset of dizziness and what sounds like almost near passing out and started having some blurred vision.  She also said that she was having some double vision.  She was seen in the emergency room after she arrived there and her private vehicle.  Code stroke was activated due to sudden onset of the symptoms and the last known normal that the patient told them was 4 PM today but on detailed history taking, she had been having symptoms off and on for many days now-she says she has been sick for 12 days.  When asking when was she last in her normal state of health, she again reiterated it was 12 days ago. There was some acute worsening of the dizziness at the doctor's office but she has had the symptoms for a while. Noncontrast head CT was concerning for a left occipital hypodensity.  Otherwise unremarkable. No evidence of bleed on the noncontrast head CT I recommended that we obtain a CTA head and neck and perfusion study given her unclear exam and somewhat confusing timeline history.  Delays with IV access.  Initial IV access obtained in the room did not work while in the CT.  IV team was called to help out, causing significant delays in obtaining vascular imaging   Past Medical History:  Diagnosis Date  . Arthritis   .  Fatty liver   . GERD (gastroesophageal reflux disease)   . Hyperlipidemia   . Hypertension   . Sarcoidosis      Current Facility-Administered Medications:  .  aspirin chewable tablet 324 mg, 324 mg, Oral, Once, Quale, Mark, MD .  sodium chloride flush (NS) 0.9 % injection 3 mL, 3 mL, Intravenous, Once, Sharyn CreamerQuale, Mark, MD  Current Outpatient Medications:  .  ARIPiprazole (ABILIFY) 5 MG tablet, Take 5 mg by mouth daily., Disp: , Rfl:  .  cyanocobalamin (,VITAMIN B-12,) 1000 MCG/ML injection, INJECT 1 ML (1000 MCG) INTO THE MUSCLE MONTHLY, Disp: , Rfl:  .  FLUoxetine (PROZAC) 20 MG capsule, TAKE ONE CAPSULE BY MOUTH TWICE DAILY, Disp: 60 capsule, Rfl: 3 .  folic acid (FOLVITE) 1 MG tablet, Take 1 mg by mouth daily., Disp: , Rfl:  .  levonorgestrel (MIRENA) 20 MCG/24HR IUD, 1 each by Intrauterine route once., Disp: , Rfl:  .  methotrexate (RHEUMATREX) 2.5 MG tablet, Take 7.5 mg by mouth once a week., Disp: , Rfl:  .  methotrexate 250 MG/10ML injection, Inject into the skin., Disp: , Rfl:  .  NYSTATIN powder, SMARTSIG:1 Application Topical 2-3 Times Daily, Disp: , Rfl:  .  pantoprazole (PROTONIX) 40 MG tablet, Take 40 mg by mouth daily., Disp: , Rfl:  .  sertraline (ZOLOFT) 100 MG tablet, Take 100 mg by mouth 2 (two) times daily., Disp: , Rfl:  .  sucralfate (CARAFATE) 1 g tablet, SMARTSIG:1 By Mouth 4-5 Times Daily, Disp: ,  Rfl:  .  venlafaxine XR (EFFEXOR-XR) 150 MG 24 hr capsule, Take 150 mg by mouth daily., Disp: , Rfl:  .  Vitamin D, Ergocalciferol, (DRISDOL) 50000 units CAPS capsule, Take 1 capsule (50,000 Units total) by mouth 2 (two) times a week., Disp: 30 capsule, Rfl: 1    LKW: Unclear but likely many days ago.  Acute worsening of existing symptoms at 4 PM. tpa given?: No, unclear last known well IR Thrombectomy? No, No LVO Modified Rankin Scale: 0-Completely asymptomatic and back to baseline post- stroke Time of teleneurologist evaluation: 5:13 PM  Exam: Vitals:   10/31/20  1658 10/31/20 1722  BP: (!) 155/105 (!) 159/99  Pulse: 86 88  Resp: 16 15  Temp: 98.3 F (36.8 C) 98 F (36.7 C)  SpO2: 99% 99%  General: Patient is awake alert oriented HEENT: Cephalic atraumatic CVS: Regular rate rhythm on the monitor Neurological exam She is awake alert oriented x3 Speech is mildly dysarthric There is no evidence of aphasia Her speech has a stutter to it as well. Cranial: Pupils equal round react light, extraocular movements intact, visual field examination reveals a partial right superior quadrantanopsia to the best of the examination within the limitations of telemedicine video evaluation, face symmetric, facial sensation diminished on the left. Motor exam: No drift on any of the 4 extremities Sensory exam: Diminished sensation on the left hemibody including the face arm and leg Coordination: No dysmetria  NIHSS 1A: Level of Consciousness - 0 1B: Ask Month and Age - 0 1C: 'Blink Eyes' & 'Squeeze Hands' - 0 2: Test Horizontal Extraocular Movements - 0 3: Test Visual Fields - 1 4: Test Facial Palsy - 0 5A: Test Left Arm Motor Drift - 0 5B: Test Right Arm Motor Drift - 0 6A: Test Left Leg Motor Drift - 0 6B: Test Right Leg Motor Drift - 0 7: Test Limb Ataxia - 0 8: Test Sensation - 2 9: Test Language/Aphasia- 0 10: Test Dysarthria - 1 11: Test Extinction/Inattention - 0 NIHSS score: 4  Imaging Reviewed:  CTH: left occipital hypodensity Stat CTA and CT perfusion-negative  Labs reviewed in epic and pertinent values follow:  CBC    Component Value Date/Time   WBC 11.9 (H) 10/31/2020 1753   RBC 4.43 10/31/2020 1753   HGB 11.9 (L) 10/31/2020 1753   HGB 13.9 11/20/2015 1509   HCT 37.6 10/31/2020 1753   HCT 41.2 11/20/2015 1509   PLT 437 (H) 10/31/2020 1753   PLT 365 11/20/2015 1509   MCV 84.9 10/31/2020 1753   MCV 81 11/20/2015 1509   MCH 26.9 10/31/2020 1753   MCHC 31.6 10/31/2020 1753   RDW 13.9 10/31/2020 1753   RDW 14.1 11/20/2015 1509    LYMPHSABS 3.0 10/31/2020 1753   MONOABS 0.8 10/31/2020 1753   EOSABS 0.1 10/31/2020 1753   BASOSABS 0.1 10/31/2020 1753   CMP     Component Value Date/Time   NA 139 03/23/2016 1456   NA 142 11/20/2015 1509   K 3.2 (L) 03/23/2016 1456   CL 107 03/23/2016 1456   CO2 23 03/23/2016 1456   GLUCOSE 91 03/23/2016 1456   BUN 9 03/23/2016 1456   BUN 6 11/20/2015 1509   CREATININE 0.74 03/23/2016 1456   CALCIUM 9.0 03/23/2016 1456   PROT 7.8 03/23/2016 1456   PROT 7.7 11/20/2015 1509   ALBUMIN 3.9 03/23/2016 1456   ALBUMIN 4.5 11/20/2015 1509   AST 20 03/23/2016 1456   ALT 14 03/23/2016 1456   ALKPHOS  77 03/23/2016 1456   BILITOT 0.5 03/23/2016 1456   BILITOT <0.2 11/20/2015 1509   GFRNONAA >60 03/23/2016 1456   GFRAA >60 03/23/2016 1456   Assessment: 44 year old with above past medical history presenting for evaluation of dizziness, presyncopal symptoms as well as blurred vision, some of the symptoms have been going on for many days as she has been sick with generalized illness and malaise and has been dizzy but the visual symptoms were of acute onset or acute worsening rather is what she told me at around 4 PM when she was waiting at the doctor's office after a presyncopal type episode. Her noncontrast head CT is concerning for an already visible hypodensity in the left occipital area which would correlate with the exam finding of her right quadrantanopsia/hemianopsia. What is puzzling is her left sided sensory deficit and not right-sided sensory deficit-I suspect there might be a brainstem stroke.  Other differential is neurosarcoidosis, which does have basal predilection. Further imaging-CTA head and neck and perfusion will be helpful-first to t/o basilar pathology and secondly assess for thrombectomy worthiness. Delay in obtaining IV access-first line failed, IV team had to be requested to come in in the CT scanner. Stat CTA head and neck and perfusion negative for LVO or perfusion  deficit. Given her history of sarcoidosis although she does not have neurosarcoidosis, it is prudent to further image her with MRI.  Given the field cut, I would pursue an MRI but I would recommend doing that with contrast-detailed recommendations below  Of note: after imaging patient does report some eye issues such as dry eye due to her sarcoidosis but has never been diagnosed with neurosarcoidosis.  Impression -Visual disturbance-cause unclear - suspicion for stroke based on CT but exam is not clearly localizable. -Evaluate for stroke-?left occipital hypodensity vs brainstem stroke -Evaluate for neurosarcoid with basal involvement  Recommendations:  Admit for stroke work-up, in addition to work up for evaluation of neurological causes of blurred vision. MRI brain with and without contrast Telemetry Frequent neurochecks Aspirin 325 for now and daily Lipitor 80mg  now and daily Permissive hypertension-allow for blood pressures to be as high as 220 and treat only if systolic is more than 220 on a as needed basis for the next 48 to 72 hours.  Recommendations will be updated after imaging and further testing 2D echocardiogram A1c Lipid panel PT OT Speech therapy N.p.o. until cleared by bedside swallow evaluation/stroke screen Outpatient ophthalmological follow-up is recommended. Might need inpatient eval based on further results in the course of hospitalization but I don't think it is urgent at this time.  Discussed with Dr. EDP and neurorads Dr. Fanny Bien on the phone (CTA prelim)  This patient is receiving care for possible acute neurological changes. There was 55 minutes of total care by this provider at the time of service, including time for direct evaluation via telemedicine, review of medical records, imaging studies and discussion of findings with providers, the patient and/or family.  -- Karin Golden, MD [facility] Pager: [phone] If 7pm to 7am, please call  on call as listed on AMION.  CRITICAL CARE ATTESTATION Performed by: [phone], MD Total critical care time: 55 minutes Critical care time was exclusive of separately billable procedures and treating other patients and/or supervising APPs/Residents/Students Critical care was necessary to treat or prevent imminent or life-threatening deterioration due to stroke like symptoms, evaluation for possible IV thrombolysis and possible mechanical thrombectomy.  This patient is critically ill and at significant risk for neurological worsening  and/or death and care requires constant monitoring. Critical care was time spent personally by me on the following activities: development of treatment plan with patient and/or surrogate as well as nursing, discussions with consultants, evaluation of patient's response to treatment, examination of patient, obtaining history from patient or surrogate, ordering and performing treatments and interventions, ordering and review of laboratory studies, ordering and review of radiographic studies, pulse oximetry, re-evaluation of patient's condition, participation in multidisciplinary rounds and medical decision making of high complexity in the care of this patient.

## 2020-10-31 NOTE — ED Provider Notes (Signed)
[facility] Emergency Department Provider Note  ____________________________________________   Event Date/Time   First MD Initiated Contact with Patient 10/31/20 1708     (approximate)  I have reviewed the triage vital signs and the nursing notes.   HISTORY  Chief Complaint Code Stroke    HPI Courtney Simpson is a 44 y.o. female history of fatty liver disease hyperlipidemia  Patient was seeing her doctor she reports for about 10 days now she has not been feeling well fatigue low-grade fevers. Reports she just does not feel too well so she went to see her doctor. While there in the office she reports she suddenly felt like she was going to pass out or became very weak, she then noticed she was having like a strange vision loss or double vision feeling.  She also reports some decreased sensation over her left arm and leg  No chest pain or trouble breathing. No shortness of breath. No nausea or vomiting. Reports fatigue for several days now  No abdominal pain. No chest pain.  No nausea  Past Medical History:  Diagnosis Date  . Arthritis   . Fatty liver   . GERD (gastroesophageal reflux disease)   . Hyperlipidemia   . Hypertension   . Sarcoidosis     Patient Active Problem List   Diagnosis Date Noted  . Hot flashes 09/22/2020  . Vitamin D deficiency 11/27/2015  . Gallstones 02/05/2014  . Morbid obesity (HCC) 02/05/2014    Past Surgical History:  Procedure Laterality Date  . ANTERIOR CRUCIATE LIGAMENT REPAIR Bilateral   . back surgery    . CHOLECYSTECTOMY    . COLONOSCOPY WITH PROPOFOL N/A 02/23/2016   Procedure: COLONOSCOPY WITH PROPOFOL;  Surgeon: Christena Deem, MD;  Location: [facility] ENDOSCOPY;  Service: Endoscopy;  Laterality: N/A;  . ESOPHAGOGASTRODUODENOSCOPY (EGD) WITH PROPOFOL N/A 02/23/2016   Procedure: ESOPHAGOGASTRODUODENOSCOPY (EGD) WITH PROPOFOL;  Surgeon: Christena Deem, MD;  Location: [facility] ENDOSCOPY;  Service: Endoscopy;   Laterality: N/A;  . GASTRIC BYPASS  03/2020    Prior to Admission medications   Medication Sig Start Date End Date Taking? Authorizing Provider  azithromycin (ZITHROMAX) 250 MG tablet Take 250-500 mg by mouth daily. 10/27/20 11/01/20 Yes [provider]  predniSONE (DELTASONE) 10 MG tablet Take 10-40 mg by mouth daily. 10/27/20  Yes [provider]  ARIPiprazole (ABILIFY) 5 MG tablet Take 5 mg by mouth daily. 06/28/20   [provider]  citalopram (CELEXA) 20 MG tablet Take 20 mg by mouth daily. 10/25/20   [provider]  cyanocobalamin (,VITAMIN B-12,) 1000 MCG/ML injection INJECT 1 ML (1000 MCG) INTO THE MUSCLE MONTHLY 12/23/17   [provider]  FLUoxetine (PROZAC) 40 MG capsule Take 40 mg by mouth daily. 09/24/20   [provider]  folic acid (FOLVITE) 1 MG tablet Take 1 mg by mouth daily. 07/02/20   [provider]  levonorgestrel (MIRENA) 20 MCG/24HR IUD 1 each by Intrauterine route once.    [provider]  methotrexate (RHEUMATREX) 2.5 MG tablet Take 7.5 mg by mouth once a week.    [provider]  methotrexate 250 MG/10ML injection Inject into the skin. 02/05/20   [provider]  NYSTATIN powder SMARTSIG:1 Application Topical 2-3 Times Daily 09/19/20   [provider]  pantoprazole (PROTONIX) 40 MG tablet Take 40 mg by mouth daily.    [provider]  sertraline (ZOLOFT) 100 MG tablet Take 100 mg by mouth 2 (two) times daily.  [provider]  sucralfate (CARAFATE) 1 g tablet SMARTSIG:1 By Mouth 4-5 Times Daily 09/19/20   [provider]  topiramate (TOPAMAX) 100 MG tablet Take 100 mg by mouth 2 (two) times daily. 10/13/20   [provider]  venlafaxine XR (EFFEXOR-XR) 150 MG 24 hr capsule Take 150 mg by mouth daily. 09/09/20   [provider]  Vitamin D, Ergocalciferol, (DRISDOL) 50000 units CAPS capsule Take 1 capsule (50,000 Units total) by  mouth 2 (two) times a week. 11/27/15   Shambley, Melody N, CNM    Allergies Eggs or egg-derived products, Other, Adhesive [tape], Septra [sulfamethoxazole-trimethoprim], Sulfa antibiotics, and Vancomycin  Family History  Problem Relation Age of Onset  . Cancer Paternal Grandmother        colon  . Cancer Paternal Grandfather        colon  . Breast cancer Neg Hx     Social History Social History   Tobacco Use  . Smoking status: Never Smoker  . Smokeless tobacco: Never Used  Vaping Use  . Vaping Use: Never used  Substance Use Topics  . Alcohol use: No  . Drug use: No    Review of Systems Constitutional: See HPI Eyes: See HPI. Reports a feeling like a double vision specially on the right visual field ENT: No sore throat. Cardiovascular: Denies chest pain. Respiratory: Denies shortness of breath. Gastrointestinal: No abdominal pain.   Genitourinary: Negative for dysuria. Musculoskeletal: Negative for back pain. Skin: Negative for rash. Neurological: See HPI. No headache. Reports a strange swimmy headed feeling    ____________________________________________   PHYSICAL EXAM:  VITAL SIGNS: ED Triage Vitals  Enc Vitals Group     BP 10/31/20 1658 (!) 155/105     Pulse Rate 10/31/20 1658 86     Resp 10/31/20 1658 16     Temp 10/31/20 1658 98.3 F (36.8 C)     Temp Source 10/31/20 1658 Oral     SpO2 10/31/20 1658 99 %     Weight 10/31/20 1724 208 lb 5.4 oz (94.5 kg)     Height 10/31/20 1724 5\' 4"  (1.626 m)     Head Circumference --      Peak Flow --      Pain Score 10/31/20 1724 4     Pain Loc --      Pain Edu? --      Excl. in GC? --     Constitutional: Alert and oriented. Well appearing and in no acute distress. Sitting up in wheelchair without distress. Slightly anxious Eyes: Conjunctivae are normal. Head: Atraumatic. Nose: No congestion/rhinnorhea. Mouth/Throat: Mucous membranes are moist. Neck: No stridor.  Cardiovascular: Normal rate, regular rhythm.  Grossly normal heart sounds.  Good peripheral circulation. Respiratory: Normal respiratory effort.  No retractions. Lungs CTAB. Gastrointestinal: Soft and nontender. No distention. Musculoskeletal: No lower extremity tenderness nor edema. Neurologic:  Normal speech and language. Mild decreased sensation left face left arm left leg but also reports some slight decreased sensation over the right forearm. No pronator drift. No ataxia. No obvious focal weakness. Cranial nerves normal except is noted decreased sensation of the left face and also on visual field testing she reports a visual abnormality or slight double loss of vision in the right eye laterally in the fields when isolated Skin:  Skin is warm, dry and intact. No rash noted. Psychiatric: Mood and affect are normal. Speech and behavior are normal.  ____________________________________________   LABS (all labs ordered are listed, but only abnormal results are  displayed)  Labs Reviewed  CBC - Abnormal; Notable for the following components:      Result Value   WBC 11.9 (*)    Hemoglobin 11.9 (*)    Platelets 437 (*)    All other components within normal limits  DIFFERENTIAL - Abnormal; Notable for the following components:   Neutro Abs 7.9 (*)    All other components within normal limits  COMPREHENSIVE METABOLIC PANEL - Abnormal; Notable for the following components:   Potassium 3.2 (*)    BUN <5 (*)    Calcium 8.4 (*)    All other components within normal limits  RESP PANEL BY RT-PCR (FLU A&B, COVID) ARPGX2  PROTIME-INR  APTT  CBG MONITORING, ED  I-STAT CREATININE, ED  POC URINE PREG, ED  CBG MONITORING, ED   ____________________________________________  EKG  Reviewed interpreted at 1900 Heart rate 89 QRS 100 QTc 540 Normal sinus rhythm, prolongation of QT interval is noted ____________________________________________  RADIOLOGY  CT HEAD CODE STROKE WO CONTRAST  Result Date: 10/31/2020 CLINICAL DATA:  Code  stroke. Near syncope. Blurred vision. Visual field defect right upper. EXAM: CT HEAD WITHOUT CONTRAST TECHNIQUE: Contiguous axial images were obtained from the base of the skull through the vertex without intravenous contrast. COMPARISON:  06/14/2016 FINDINGS: Brain: Brainstem and cerebellum are normal. Probable early infarction in the left occipital lobe. The remainder the brain is normal. No sign of mass, hemorrhage, hydrocephalus or extra-axial collection. Vascular: Normal Skull: Normal Sinuses/Orbits: Normal Other: None ASPECTS (Alberta Stroke Program Early CT Score) - Ganglionic level infarction (caudate, lentiform nuclei, internal capsule, insula, M1-M3 cortex): 7 - Supraganglionic infarction (M4-M6 cortex): 3 Total score (0-10 with 10 being normal): 10 IMPRESSION: 1. Probable early infarction in the left occipital lobe. No hemorrhage. 2. ASPECTS is 10. 3. These results were called by telephone at the time of interpretation on 10/31/2020 at 5:15 pm to provider Willy Eddy , who verbally acknowledged these results. Electronically Signed   By: Paulina Fusi M.D.   On: 10/31/2020 17:16     Imaging reviewed notable for concerns of possible early acute stroke in the left occipital lobe. ____________________________________________   PROCEDURES  Procedure(s) performed: Angiocatheter   Angiocath insertion Performed by: Sharyn Creamer  Consent: Verbal consent obtained. Risks and benefits: risks, benefits and alternatives were discussed Time out: Immediately prior to procedure verify the correct patient, procedure, equipment, support staff and site/side marked as required.  Preparation: Patient was prepped and draped in the usual sterile fashion.  Vein Location: Right antecubital  Ultrasound Guided  Gauge: 18  Normal blood return and flush without difficulty Patient tolerance: Patient tolerated the procedure well with no immediate complications.     Procedures  Critical Care  performed: Yes, see critical care note(s)  CRITICAL CARE Performed by: Sharyn Creamer   Total critical care time: 50 minutes  Critical care time was exclusive of separately billable procedures and treating other patients.  Critical care was necessary to treat or prevent imminent or life-threatening deterioration.  Critical care was time spent personally by me on the following activities: development of treatment plan with patient and/or surrogate as well as nursing, discussions with consultants, evaluation of patient's response to treatment, examination of patient, obtaining history from patient or surrogate, ordering and performing treatments and interventions, ordering and review of laboratory studies, ordering and review of radiographic studies, pulse oximetry and re-evaluation of patient's condition.  ____________________________________________   INITIAL IMPRESSION / ASSESSMENT AND PLAN / ED COURSE  Pertinent labs & imaging  results that were available during my care of the patient were reviewed by me and considered in my medical decision making (see chart for details).   Patient reports several days of fatigue low-grade fevers and weakness about 10 days. Reports she tested negative for Covid recently, saw her doctor today and had sudden onset of symptoms. She has clinical exam findings that are concerning for possible stroke but some of her symptoms including decreased sensation over the right forearm seems somewhat unusual unless she were to have had some sort of embolic type event, though she does also consistently report left sided paresthesia and decreased sensation along with a right visual field cut. Teleneurology consulted stat as part of code stroke process.  Clinical Course as of 10/31/20 1920  Fri Oct 31, 2020  1725 Neurology (tele) with patient now  [MQ]  1737 Dr. Jerrell Belfast advises against use of TPA due to some preceding symptoms and unclear obvious last known well. He does  however recommend and reports he is ordering CT perfusion study at this time [MQ]  1752 Notified by nursing of no ability to establish peripheral IV.  I am currently going to assist with attempted ultrasound IV for CT angiogram [MQ]  1856 Dr. Jerrell Belfast called, he is reviewed CT angiogram and advises no large vessel occlusion.  Recommends aspirin and admission for stroke evaluation here at [facility].  He will advised the stroke team to follow-up and round on the patient tomorrow [MQ]    Clinical Course User Index [MQ] Sharyn Creamer, MD    ----------------------------------------- 5:32 PM on 10/31/2020 -----------------------------------------  Currently awaiting recommendations from neurology stroke consult  Admission discussed with Dr. Sedalia Muta ____________________________________________   FINAL CLINICAL IMPRESSION(S) / ED DIAGNOSES  Final diagnoses:  Cerebral infarction, unspecified mechanism (HCC)        Note:  This document was prepared using Dragon voice recognition software and may include unintentional dictation errors       Sharyn Creamer, MD 10/31/20 1920

## 2020-11-01 DIAGNOSIS — Z20822 Contact with and (suspected) exposure to covid-19: Secondary | ICD-10-CM | POA: Diagnosis present

## 2020-11-01 DIAGNOSIS — H538 Other visual disturbances: Secondary | ICD-10-CM

## 2020-11-01 DIAGNOSIS — F3341 Major depressive disorder, recurrent, in partial remission: Secondary | ICD-10-CM | POA: Diagnosis not present

## 2020-11-01 DIAGNOSIS — G4459 Other complicated headache syndrome: Secondary | ICD-10-CM | POA: Diagnosis present

## 2020-11-01 DIAGNOSIS — D860 Sarcoidosis of lung: Secondary | ICD-10-CM | POA: Diagnosis present

## 2020-11-01 DIAGNOSIS — Z9884 Bariatric surgery status: Secondary | ICD-10-CM | POA: Diagnosis not present

## 2020-11-01 DIAGNOSIS — K219 Gastro-esophageal reflux disease without esophagitis: Secondary | ICD-10-CM | POA: Diagnosis present

## 2020-11-01 DIAGNOSIS — I10 Essential (primary) hypertension: Secondary | ICD-10-CM | POA: Diagnosis present

## 2020-11-01 DIAGNOSIS — G4489 Other headache syndrome: Secondary | ICD-10-CM | POA: Diagnosis not present

## 2020-11-01 DIAGNOSIS — K2960 Other gastritis without bleeding: Secondary | ICD-10-CM | POA: Diagnosis present

## 2020-11-01 DIAGNOSIS — Z8711 Personal history of peptic ulcer disease: Secondary | ICD-10-CM | POA: Diagnosis not present

## 2020-11-01 DIAGNOSIS — G4730 Sleep apnea, unspecified: Secondary | ICD-10-CM | POA: Diagnosis present

## 2020-11-01 DIAGNOSIS — K760 Fatty (change of) liver, not elsewhere classified: Secondary | ICD-10-CM | POA: Diagnosis present

## 2020-11-01 DIAGNOSIS — F32A Depression, unspecified: Secondary | ICD-10-CM | POA: Diagnosis present

## 2020-11-01 DIAGNOSIS — G932 Benign intracranial hypertension: Secondary | ICD-10-CM | POA: Diagnosis not present

## 2020-11-01 DIAGNOSIS — D869 Sarcoidosis, unspecified: Secondary | ICD-10-CM | POA: Diagnosis not present

## 2020-11-01 DIAGNOSIS — R531 Weakness: Secondary | ICD-10-CM | POA: Diagnosis present

## 2020-11-01 DIAGNOSIS — K5790 Diverticulosis of intestine, part unspecified, without perforation or abscess without bleeding: Secondary | ICD-10-CM | POA: Diagnosis present

## 2020-11-01 DIAGNOSIS — D849 Immunodeficiency, unspecified: Secondary | ICD-10-CM | POA: Diagnosis present

## 2020-11-01 DIAGNOSIS — I6529 Occlusion and stenosis of unspecified carotid artery: Secondary | ICD-10-CM | POA: Diagnosis present

## 2020-11-01 DIAGNOSIS — E669 Obesity, unspecified: Secondary | ICD-10-CM | POA: Diagnosis not present

## 2020-11-01 DIAGNOSIS — E785 Hyperlipidemia, unspecified: Secondary | ICD-10-CM | POA: Diagnosis present

## 2020-11-01 DIAGNOSIS — Z79899 Other long term (current) drug therapy: Secondary | ICD-10-CM | POA: Diagnosis not present

## 2020-11-01 DIAGNOSIS — M1990 Unspecified osteoarthritis, unspecified site: Secondary | ICD-10-CM | POA: Diagnosis present

## 2020-11-01 DIAGNOSIS — Z882 Allergy status to sulfonamides status: Secondary | ICD-10-CM | POA: Diagnosis not present

## 2020-11-01 DIAGNOSIS — F419 Anxiety disorder, unspecified: Secondary | ICD-10-CM | POA: Diagnosis present

## 2020-11-01 DIAGNOSIS — D8683 Sarcoid iridocyclitis: Secondary | ICD-10-CM | POA: Diagnosis present

## 2020-11-01 DIAGNOSIS — Z9049 Acquired absence of other specified parts of digestive tract: Secondary | ICD-10-CM | POA: Diagnosis not present

## 2020-11-01 DIAGNOSIS — R569 Unspecified convulsions: Secondary | ICD-10-CM | POA: Diagnosis present

## 2020-11-01 DIAGNOSIS — E538 Deficiency of other specified B group vitamins: Secondary | ICD-10-CM | POA: Diagnosis present

## 2020-11-01 MED ORDER — DIPHENHYDRAMINE HCL 25 MG PO CAPS
25.0000 mg | ORAL_CAPSULE | Freq: Four times a day (QID) | ORAL | Status: DC | PRN
Start: 2020-11-01 — End: 2020-11-02

## 2020-11-01 MED ORDER — PROCHLORPERAZINE MALEATE 5 MG PO TABS
5.0000 mg | ORAL_TABLET | Freq: Four times a day (QID) | ORAL | Status: DC | PRN
Start: 2020-11-01 — End: 2020-11-02

## 2020-11-01 NOTE — Progress Notes (Signed)
SLP Cancellation Note  Patient Details Name: TERRIKA ZUVER MRN: [id number] DOB: 04/24/1976   Cancelled treatment:       Reason Eval/Treat Not Completed: SLP screened, no needs identified, will sign off (chart reviewed; consulted NSG then met w/ pt/family) Pt denied any difficulty swallowing and is currently on a regular diet; tolerates swallowing pills w/ water per NSG and drank OJ while in the room w/out difficulty. Pt conversed at conversational level w/out deficits noted; pt and family denied any speech-language deficits.  No further skilled ST services indicated as pt appears at her baseline. Pt agreed. NSG to reconsult if any change in status while admitted.    Orinda Kenner, MS, CCC-SLP Speech Language Pathologist Rehab Services [phone] [facility] 11/01/2020, 8:32 AM

## 2020-11-01 NOTE — Procedures (Signed)
Procedure: Fluoro guided LP MD: Meda Klinefelter EBL: minimal Specimens: 17 ml slightly pink tinged CSF Preprocedure diagnosis: headache Post procedure diagnosis: headache.

## 2020-11-01 NOTE — ED Notes (Signed)
Pt ambulatory to toilet with steady gait.  

## 2020-11-01 NOTE — Progress Notes (Signed)
PT Cancellation Note  Patient Details Name: Courtney Simpson MRN: [id number] DOB: [birth year]   Cancelled Treatment:    Reason Eval/Treat Not Completed: OT screened, no needs identified, will sign off;PT screened, no needs identified, will sign off Pt ambulating to restroom with steady gait with nursing and OT. Per OT report no needs identified. Tri[facility]  Hilda Lias DPT Hilda Lias 11/01/2020, 12:40 PM

## 2020-11-01 NOTE — ED Notes (Signed)
Neurologist at bedside.  Pt provided breakfast tray.

## 2020-11-01 NOTE — ED Notes (Signed)
Occupational therapy at bedside.

## 2020-11-01 NOTE — Progress Notes (Signed)
PROGRESS NOTE    Courtney Simpson  NFA:[id number] DOB: Feb 10, 1976 DOA: 10/31/2020 PCP: Lynnea Ferrier, MD   Brief Narrative:  44 y.o. female with medical history significant for sarcoidosis involving the lung diagnosed in 2008 and associated uveitis, B12 deficiency, status post bariatric surgery - bypass per patient, history of marginal ulcer, erosive gastritis 02/23/2016, depression/anxiety, h/o pseudoseizure hospitalized at [facility], diverticulosis, obesity, sleep apnea in 2007 and does not wear a cpap mask anymore, presented to the ED for chief concern of headache, dizziness, feeling faint, and tingling of her left extremities.   She reports her last known normal was approximately 1300 on day of presentation. She was sitting in recliner with pet puppy when she experienced her head hurting, posterior left side, sharp pain that became dull, lasting about 30 minutes, went away for two hours, resuming at approximately 1404 about 30-45 minutes, at it's peak the headache was an 8/10 and now has improved to 7/10.  12/18: Patient seen and examined in the emergency room.  Mother at bedside.  Discussed that imaging survey for CVA was negative including MR brain and CT angiography.  Case discussed with neurology.  Concern for complicated high pressure headache given the patient's history.  Ordered image guided lumbar puncture as well as extensive MRI survey of the spine and orbits.   Assessment & Plan:   Principal Problem:   Stroke [facility]) Active Problems:   Morbid obesity (HCC)   Sarcoidosis of lung (HCC)   Sarcoidosis   Uveitis   Obesity (BMI 30-39.9)   Depression   Sleep apnea in adult  Weakness Blurred vision Concern for high pressure headache Patient's imaging survey negative for acute stroke Presentation still concerning for high pressure headache Possible ocular involvement considering a history of sarcoid Plan: MRI total spine MRI orbits Image guided lumbar puncture, to be done by  radiology today Okay for diet, passed bedside swallow study Therapy evaluations Appreciate neurology follow-up  Pulmonary sarcoidosis History of uveitis Chronic immunosuppression Patient on weekly methotrexate injections  Depression/anxiety venlafaxine 150 mg XR capsule daily with breakfast fluoxetine 40 mg capsules daily Abilify 5 mg daily sertraline 100 mg BID  Sleep apnea patient not wearing CPAP extensive discussion with patient regarding the importance of wearing CPAP machine  GERD  protonix 40 mg daily    DVT prophylaxis: SCD Code Status: Full Family Communication: Mother at bedside Disposition Plan: Status is: Observation  The patient will require care spanning > 2 midnights and should be moved to inpatient because: Inpatient level of care appropriate due to severity of illness  Dispo: The patient is from: Home              Anticipated d/c is to: Home              Anticipated d/c date is: 2 days              Patient currently is not medically stable to d/c.  Further work-up for etiology of patient's neurologic symptoms.  Disposition plan pending       Consultants:   Neurology  Procedures:  Lumbar puncture, 11/01/2020  Antimicrobials:   None   Subjective: Patient seen and examined.  Visibly no distress.  Mother at bedside.  No pain complaints  Objective: Vitals:   11/01/20 1020 11/01/20 1141 11/01/20 1226 11/01/20 1352  BP: (!) 150/101 132/89 (!) 120/93 (!) 141/87  Pulse: 77 83 83 76  Resp: Temp:  98.1 F (36.7 C) 98.1  F (36.7 C) 98.9 F (37.2 C)  TempSrc: Oral Oral Oral Oral  SpO2: 96% 96% 98% 98%  Weight:      Height:       No intake or output data in the 24 hours ending 11/01/20 1449 Filed Weights   10/31/20 1724  Weight: 94.5 kg    Examination:  General exam: Appears calm and comfortable  Respiratory system: Clear to auscultation. Respiratory effort normal. Cardiovascular system: S1 & S2 heard, RRR. No JVD,  murmurs, rubs, gallops or clicks. No pedal edema. Gastrointestinal system: Abdomen is nondistended, soft and nontender. No organomegaly or masses felt. Normal bowel sounds heard. Central nervous system: Alert, oriented, blurred vision in right eye, Extremities: Symmetric 5 x 5 power. Skin: No rashes, lesions or ulcers Psychiatry: Judgement and insight appear normal. Mood & affect appropriate.     Data Reviewed: I have personally reviewed following labs and imaging studies  CBC: Recent Labs  Lab 10/31/20 1753 11/01/20 1424  WBC 11.9* 9.6  NEUTROABS 7.9* 8.6*  HGB 11.9* 12.0  HCT 37.6 37.3  MCV 84.9 84.0  PLT 437* 386   Basic Metabolic Panel: Recent Labs  Lab 10/31/20 1753  NA 139  K 3.2*  CL 101  CO2 27  GLUCOSE 96  BUN <5*  CREATININE 0.66  CALCIUM 8.4*   GFR: Estimated Creatinine Clearance: 97.9 mL/min (by C-G formula based on SCr of 0.66 mg/dL). Liver Function Tests: Recent Labs  Lab 10/31/20 1753  AST 24  ALT 16  ALKPHOS 125  BILITOT 0.5  PROT 7.5  ALBUMIN 3.6   No results for input(s): LIPASE, AMYLASE in the last 168 hours. No results for input(s): AMMONIA in the last 168 hours. Coagulation Profile: Recent Labs  Lab 10/31/20 1753  INR 1.0   Cardiac Enzymes: No results for input(s): CKTOTAL, CKMB, CKMBINDEX, TROPONINI in the last 168 hours. BNP (last 3 results) No results for input(s): PROBNP in the last 8760 hours. HbA1C: No results for input(s): HGBA1C in the last 72 hours. CBG: Recent Labs  Lab 10/31/20 1718  GLUCAP 88   Lipid Profile: Recent Labs    11/01/20 1117  CHOL 190  HDL 61  LDLCALC 107*  TRIG 109  CHOLHDL 3.1   Thyroid Function Tests: No results for input(s): TSH, T4TOTAL, FREET4, T3FREE, THYROIDAB in the last 72 hours. Anemia Panel: No results for input(s): VITAMINB12, FOLATE, FERRITIN, TIBC, IRON, RETICCTPCT in the last 72 hours. Sepsis Labs: No results for input(s): PROCALCITON, LATICACIDVEN in the last 168  hours.  Recent Results (from the past 240 hour(s))  Resp Panel by RT-PCR (Flu A&B, Covid) Nasopharyngeal Swab     Status: None   Collection Time: 10/31/20 11:21 PM   Specimen: Nasopharyngeal Swab; Nasopharyngeal(NP) swabs in vial transport medium  Result Value Ref Range Status   SARS Coronavirus 2 by RT PCR NEGATIVE NEGATIVE Final    Comment: (NOTE) SARS-CoV-2 target nucleic acids are NOT DETECTED.  The SARS-CoV-2 RNA is generally detectable in upper respiratory specimens during the acute phase of infection. The lowest concentration of SARS-CoV-2 viral copies this assay can detect is 138 copies/mL. A negative result does not preclude SARS-Cov-2 infection and should not be used as the sole basis for treatment or other patient management decisions. A negative result may occur with  improper specimen collection/handling, submission of specimen other than nasopharyngeal swab, presence of viral mutation(s) within the areas targeted by this assay, and inadequate number of viral copies(<138 copies/mL). A negative result must be combined with  clinical observations, patient history, and epidemiological information. The expected result is Negative.  Fact Sheet for Patients:  BloggerCourse.com  Fact Sheet for Healthcare Providers:  SeriousBroker.it  This test is no t yet approved or cleared by the [place] FDA and  has been authorized for detection and/or diagnosis of SARS-CoV-2 by FDA under an Emergency Use Authorization (EUA). This EUA will remain  in effect (meaning this test can be used) for the duration of the COVID-19 declaration under Section 564(b)(1) of the Act, 21 U.S.C.section 360bbb-3(b)(1), unless the authorization is terminated  or revoked sooner.       Influenza A by PCR NEGATIVE NEGATIVE Final   Influenza B by PCR NEGATIVE NEGATIVE Final    Comment: (NOTE) The Xpert Xpress SARS-CoV-2/FLU/RSV plus assay is intended  as an aid in the diagnosis of influenza from Nasopharyngeal swab specimens and should not be used as a sole basis for treatment. Nasal washings and aspirates are unacceptable for Xpert Xpress SARS-CoV-2/FLU/RSV testing.  Fact Sheet for Patients: BloggerCourse.com  Fact Sheet for Healthcare Providers: SeriousBroker.it  This test is not yet approved or cleared by the [place] FDA and has been authorized for detection and/or diagnosis of SARS-CoV-2 by FDA under an Emergency Use Authorization (EUA). This EUA will remain in effect (meaning this test can be used) for the duration of the COVID-19 declaration under Section 564(b)(1) of the Act, 21 U.S.C. section 360bbb-3(b)(1), unless the authorization is terminated or revoked.  Performed at [facility], [address] [place], [place] [address]   CSF culture with Stat gram stain     Status: None (Preliminary result)   Collection Time: 11/01/20 12:54 PM   Specimen: CSF; Cerebrospinal Fluid  Result Value Ref Range Status   Specimen Description CSF  Final   Special Requests Immunocompromised  Final   Gram Stain   Final    NO ORGANISMS SEEN RBC'S SEEN NO WBC'S SEEN Performed at [facility], [address], [place], [place] [address]    Culture PENDING  Incomplete   Report Status PENDING  Incomplete         Radiology Studies: CT Code Stroke CTA Head W/WO contrast  Result Date: 10/31/2020 CLINICAL DATA:  Right visual field defect. Occipital stroke shown by head CT. EXAM: CT ANGIOGRAPHY HEAD AND NECK CT PERFUSION BRAIN TECHNIQUE: Multidetector CT imaging of the head and neck was performed using the standard protocol during bolus administration of intravenous contrast. Multiplanar CT image reconstructions and MIPs were obtained to evaluate the vascular anatomy. Carotid stenosis measurements (when applicable) are obtained utilizing NASCET criteria, using the  distal internal carotid diameter as the denominator. Multiphase CT imaging of the brain was performed following IV bolus contrast injection. Subsequent parametric perfusion maps were calculated using RAPID software. CONTRAST:  OMNIPAQUE IOHEXOL 350 MG/ML SOLN COMPARISON:  Head CT earlier same day FINDINGS: CT HEAD FINDINGS CTA NECK FINDINGS Aortic arch: Normal.  No atherosclerotic change. Right carotid system: Normal.  No atherosclerotic change. Left carotid system: Common carotid artery widely patent. There is some soft and calcified plaque at the proximal ICA bulb on the left but without measurable stenosis. Cervical ICA widely patent beyond that. Vertebral arteries: Both vertebral artery origins are widely patent. Both vertebral arteries appear normal through the cervical region to the foramen magnum. Skeleton: Previous ACDF. Other neck: No neck mass or lymphadenopathy.  Normal Upper chest: Normal Review of the MIP images confirms the above findings CTA HEAD FINDINGS Anterior circulation: Both internal carotid arteries  widely patent through the skull base and siphon regions. The anterior and middle cerebral vessels are normal without large or medium vessel occlusion or proximal stenosis. Posterior circulation: Both vertebral arteries widely patent to the basilar. No basilar stenosis. Posterior circulation branch vessels appear normal. This includes the left PCA which appears presently normal. I think there is a tiny posterior communicating artery on each side. Venous sinuses: Patent and normal. Anatomic variants: None significant. Review of the MIP images confirms the above findings CT Brain Perfusion Findings: ASPECTS: 10 CBF (<30%) Volume: 0mL Perfusion (Tmax>6.0s) volume: 0mL Mismatch Volume: 0mL Infarction Location:None by perfusion.  Left occipital by CT. IMPRESSION: 1. Negative acute evaluation. No large or medium vessel occlusion. Normal perfusion study. Left occipital stroke is probably subacute,  with recanalization of the left PCA and pseudo normalization of flow and perfusion parameters at this time. 2. Mild atherosclerotic disease at the left ICA bulb but without measurable stenosis. Electronically Signed   By: Paulina FusiMark  Shogry M.D.   On: 10/31/2020 19:00   CT Code Stroke CTA Neck W/WO contrast  Result Date: 10/31/2020 CLINICAL DATA:  Right visual field defect. Occipital stroke shown by head CT. EXAM: CT ANGIOGRAPHY HEAD AND NECK CT PERFUSION BRAIN TECHNIQUE: Multidetector CT imaging of the head and neck was performed using the standard protocol during bolus administration of intravenous contrast. Multiplanar CT image reconstructions and MIPs were obtained to evaluate the vascular anatomy. Carotid stenosis measurements (when applicable) are obtained utilizing NASCET criteria, using the distal internal carotid diameter as the denominator. Multiphase CT imaging of the brain was performed following IV bolus contrast injection. Subsequent parametric perfusion maps were calculated using RAPID software. CONTRAST:  100mL OMNIPAQUE IOHEXOL 350 MG/ML SOLN COMPARISON:  Head CT earlier same day FINDINGS: CT HEAD FINDINGS CTA NECK FINDINGS Aortic arch: Normal.  No atherosclerotic change. Right carotid system: Normal.  No atherosclerotic change. Left carotid system: Common carotid artery widely patent. There is some soft and calcified plaque at the proximal ICA bulb on the left but without measurable stenosis. Cervical ICA widely patent beyond that. Vertebral arteries: Both vertebral artery origins are widely patent. Both vertebral arteries appear normal through the cervical region to the foramen magnum. Skeleton: Previous ACDF. Other neck: No neck mass or lymphadenopathy.  Normal Upper chest: Normal Review of the MIP images confirms the above findings CTA HEAD FINDINGS Anterior circulation: Both internal carotid arteries widely patent through the skull base and siphon regions. The anterior and middle cerebral  vessels are normal without large or medium vessel occlusion or proximal stenosis. Posterior circulation: Both vertebral arteries widely patent to the basilar. No basilar stenosis. Posterior circulation branch vessels appear normal. This includes the left PCA which appears presently normal. I think there is a tiny posterior communicating artery on each side. Venous sinuses: Patent and normal. Anatomic variants: None significant. Review of the MIP images confirms the above findings CT Brain Perfusion Findings: ASPECTS: 10 CBF (<30%) Volume: 0mL Perfusion (Tmax>6.0s) volume: 0mL Mismatch Volume: 0mL Infarction Location:None by perfusion.  Left occipital by CT. IMPRESSION: 1. Negative acute evaluation. No large or medium vessel occlusion. Normal perfusion study. Left occipital stroke is probably subacute, with recanalization of the left PCA and pseudo normalization of flow and perfusion parameters at this time. 2. Mild atherosclerotic disease at the left ICA bulb but without measurable stenosis. Electronically Signed   By: Paulina FusiMark  Shogry M.D.   On: 10/31/2020 19:00   MR BRAIN W WO CONTRAST  Result Date: 10/31/2020 CLINICAL DATA:  Dizziness  of sudden onset. Near syncope. Blurred vision. Double vision. Occipital abnormality on the left by CT but negative CTA and perfusion study. EXAM: MRI HEAD WITHOUT AND WITH CONTRAST TECHNIQUE: Multiplanar, multiecho pulse sequences of the brain and surrounding structures were obtained without and with intravenous contrast. CONTRAST:  7.64mL GADAVIST GADOBUTROL 1 MMOL/ML IV SOLN COMPARISON:  CT studies earlier same day FINDINGS: Brain: The brain has a normal appearance without evidence of malformation, atrophy, old or acute small or large vessel infarction, mass lesion, hemorrhage, hydrocephalus or extra-axial collection. After contrast administration, no abnormal enhancement occurs. There is no abnormality in the left occipital lobe. Therefore, though initially rather convincing by  CT, the finding was artifactual. Vascular: Major vessels at the base of the brain show flow. Venous sinuses appear patent. Skull and upper cervical spine: Normal. Sinuses/Orbits: Clear/normal. Other: None significant. IMPRESSION: Normal brain MRI. No abnormality seen to explain the presenting symptoms. The left occipital lobe is normal. Though initially rather convincing by CT, the finding was artifactual. Electronically Signed   By: Paulina Fusi M.D.   On: 10/31/2020 21:12   CT Code Stroke Cerebral Perfusion with contrast  Result Date: 10/31/2020 CLINICAL DATA:  Right visual field defect. Occipital stroke shown by head CT. EXAM: CT ANGIOGRAPHY HEAD AND NECK CT PERFUSION BRAIN TECHNIQUE: Multidetector CT imaging of the head and neck was performed using the standard protocol during bolus administration of intravenous contrast. Multiplanar CT image reconstructions and MIPs were obtained to evaluate the vascular anatomy. Carotid stenosis measurements (when applicable) are obtained utilizing NASCET criteria, using the distal internal carotid diameter as the denominator. Multiphase CT imaging of the brain was performed following IV bolus contrast injection. Subsequent parametric perfusion maps were calculated using RAPID software. CONTRAST:  OMNIPAQUE IOHEXOL 350 MG/ML SOLN COMPARISON:  Head CT earlier same day FINDINGS: CT HEAD FINDINGS CTA NECK FINDINGS Aortic arch: Normal.  No atherosclerotic change. Right carotid system: Normal.  No atherosclerotic change. Left carotid system: Common carotid artery widely patent. There is some soft and calcified plaque at the proximal ICA bulb on the left but without measurable stenosis. Cervical ICA widely patent beyond that. Vertebral arteries: Both vertebral artery origins are widely patent. Both vertebral arteries appear normal through the cervical region to the foramen magnum. Skeleton: Previous ACDF. Other neck: No neck mass or lymphadenopathy.  Normal Upper chest:  Normal Review of the MIP images confirms the above findings CTA HEAD FINDINGS Anterior circulation: Both internal carotid arteries widely patent through the skull base and siphon regions. The anterior and middle cerebral vessels are normal without large or medium vessel occlusion or proximal stenosis. Posterior circulation: Both vertebral arteries widely patent to the basilar. No basilar stenosis. Posterior circulation branch vessels appear normal. This includes the left PCA which appears presently normal. I think there is a tiny posterior communicating artery on each side. Venous sinuses: Patent and normal. Anatomic variants: None significant. Review of the MIP images confirms the above findings CT Brain Perfusion Findings: ASPECTS: 10 CBF (<30%) Volume: 10mL Perfusion (Tmax>6.0s) volume: 19mL Mismatch Volume: 71mL Infarction Location:None by perfusion.  Left occipital by CT. IMPRESSION: 1. Negative acute evaluation. No large or medium vessel occlusion. Normal perfusion study. Left occipital stroke is probably subacute, with recanalization of the left PCA and pseudo normalization of flow and perfusion parameters at this time. 2. Mild atherosclerotic disease at the left ICA bulb but without measurable stenosis. Electronically Signed   By: Paulina Fusi M.D.   On: 10/31/2020 19:00   CT  HEAD CODE STROKE WO CONTRAST  Result Date: 10/31/2020 CLINICAL DATA:  Code stroke. Near syncope. Blurred vision. Visual field defect right upper. EXAM: CT HEAD WITHOUT CONTRAST TECHNIQUE: Contiguous axial images were obtained from the base of the skull through the vertex without intravenous contrast. COMPARISON:  06/14/2016 FINDINGS: Brain: Brainstem and cerebellum are normal. Probable early infarction in the left occipital lobe. The remainder the brain is normal. No sign of mass, hemorrhage, hydrocephalus or extra-axial collection. Vascular: Normal Skull: Normal Sinuses/Orbits: Normal Other: None ASPECTS ([place] Stroke Program Early  CT Score) - Ganglionic level infarction (caudate, lentiform nuclei, internal capsule, insula, M1-M3 cortex): 7 - Supraganglionic infarction (M4-M6 cortex): 3 Total score (0-10 with 10 being normal): 10 IMPRESSION: 1. Probable early infarction in the left occipital lobe. No hemorrhage. 2. ASPECTS is 10. 3. These results were called by telephone at the time of interpretation on 10/31/2020 at 5:15 pm to provider Willy Eddy , who verbally acknowledged these results. Electronically Signed   By: Paulina Fusi M.D.   On: 10/31/2020 17:16   DG FL GUIDED LUMBAR PUNCTURE  Result Date: 11/01/2020 CLINICAL DATA:  Headache.  History of sarcoidosis EXAM: DIAGNOSTIC LUMBAR PUNCTURE UNDER FLUOROSCOPIC GUIDANCE FLUOROSCOPY TIME:  Fluoroscopy Time:  30 seconds Radiation Exposure Index (if provided by the fluoroscopic device): 28.6 mGy Number of Acquired Spot Images: 4 PROCEDURE: Informed consent was obtained from the patient prior to the procedure, including potential complications of headache, allergy, and pain. With the patient prone, the lower back was prepped with Betadine and chlorhexidine. 1% Lidocaine was used for local anesthesia. Lumbar puncture was performed at the L3-4 level using a 22 gauge 5 inch needle. An opening pressure could not be obtained accurately in spite of multiple attempts and repositioning. Seventeen ml of pink tinged CSF were obtained for laboratory studies. The patient tolerated the procedure well and there were no apparent complications. IMPRESSION: Technically successful lumbar puncture. Electronically Signed   By: Meda Klinefelter MD   On: 11/01/2020 13:41        Scheduled Meds: .  stroke: mapping our early stages of recovery book   Does not apply Once  . ARIPiprazole  5 mg Oral Daily  . aspirin  81 mg Oral Daily  . atorvastatin  80 mg Oral QHS  . FLUoxetine  40 mg Oral Daily  . folic acid  1 mg Oral Daily  . pantoprazole  40 mg Oral Daily  . sertraline  100 mg Oral BID  .  venlafaxine XR  150 mg Oral Daily   Continuous Infusions:   LOS: 0 days    Time spent: 25 minutes    Tresa Moore, MD [facility] Pager [phone]  If 7PM-7AM, please contact night-coverage 11/01/2020, 2:49 PM

## 2020-11-01 NOTE — Evaluation (Signed)
Occupational Therapy Evaluation Patient Details Name: Courtney Simpson MRN: [id number] DOB: [birth year] Today's Date: 11/01/2020    History of Present Illness Pt is a 44 y/o F with PMH: HTN, HLD, sarcoidosis, who has been sick with non specific symptoms of malaise, dizziness, was at her doctor's office to get checked out when she noted sudden onset of dizziness and what sounds like almost near passing out and started having some blurred vision. Noncontrast head CT was concerning for a left occipital hypodensity, but otherwise unremarkable.  Noncontrast Head CT showed No evidence of bleed. MRI of brain was normal with No abnormality seen to explain the presenting symptoms. The left occipital lobe is normal. Pt does endorse some h/o dry eyes 2/2 sarcoidosis but has not documented dx of neurosarcoidosis per neurology note.   Clinical Impression   Pt seen for OT evaluation this date in setting of presenting to ED from MD's office d/t dizziness and blurred vision. At time of OT assessment, pt reports these symptoms are no longer present. Pt reports being INDEP at baseline with ADLs/ADL mobility. On assessment, demos ability to perform all aspects of self care with MOD I for increased time, but no physical assist from OT or need for AD/DME. Pt lives in [facility] with 4 STE with R railing with her spouse. Pt has supportive parent in room with her throughout assessment. Based on ADLs and ADL mobility assessed this date, do not anticipate need for acute OT services, nor need for f/u OT upon d/c from acute setting. Pt is safe to d/c to her home environment from OT standpoint.     Follow Up Recommendations  No OT follow up    Equipment Recommendations  None recommended by OT    Recommendations for Other Services       Precautions / Restrictions Restrictions Weight Bearing Restrictions: No      Mobility Bed Mobility Overal bed mobility: Independent                  Transfers Overall transfer  level: Independent                    Balance Overall balance assessment: Independent                                         ADL either performed or assessed with clinical judgement   ADL Overall ADL's : Modified independent                                       General ADL Comments: increased time, but no physical assist or need for AD     Vision Baseline Vision/History: Wears glasses Patient Visual Report: No change from baseline Additional Comments: Pt reported blurry vision on presentation to ED, but no c/o blurred vision at this time. Pt opccipital lobe normal on MRI. Pt reports decreased peripheral vision at baseline. Notices no acute changes with her vision and tracks appropriately throughout session.     Perception     Praxis      Pertinent Vitals/Pain Pain Assessment: No/denies pain     Hand Dominance     Extremity/Trunk Assessment Upper Extremity Assessment Upper Extremity Assessment: Overall WFL for tasks assessed   Lower Extremity Assessment Lower Extremity Assessment: Overall WFL for tasks assessed  Communication Communication Communication: No difficulties   Cognition Arousal/Alertness: Awake/alert Behavior During Therapy: WFL for tasks assessed/performed Overall Cognitive Status: Within Functional Limits for tasks assessed                                     General Comments       Exercises Other Exercises Other Exercises: OT faciltiates ed re: role of OT. Pt and pt's mother who is present throughout assessment demonstrate good understanding and report that pt appears to be at her functional baseline with her self care.   Shoulder Instructions      Home Living Family/patient expects to be discharged to:: Private residence Living Arrangements: Spouse/significant other Available Help at Discharge: Family;Available PRN/intermittently Type of Home: House Home Access: Stairs to  enter [employer] of Steps: 4 Entrance Stairs-Rails: Right Home Layout: One level               Home Equipment: None   Additional Comments: Pt was INDEP with all self care and IADLs. Pt is off work for disability for back prooceudres over several years. Does not use any AD.      Prior Functioning/Environment Level of Independence: Independent                 OT Problem List: Decreased activity tolerance;Impaired vision/perception      OT Treatment/Interventions: Self-care/ADL training;Therapeutic activities    OT Goals(Current goals can be found in the care plan section) Acute Rehab OT Goals Patient Stated Goal: to go home OT Goal Formulation: All assessment and education complete, DC therapy  OT Frequency:     Barriers to D/C:            Co-evaluation              AM-PAC OT "6 Clicks" Daily Activity     Outcome Measure Help from another person eating meals?: None Help from another person taking care of personal grooming?: None Help from another person toileting, which includes using toliet, bedpan, or urinal?: None Help from another person bathing (including washing, rinsing, drying)?: None Help from another person to put on and taking off regular upper body clothing?: None Help from another person to put on and taking off regular lower body clothing?: None 6 Click Score: 24   End of Session Nurse Communication: Mobility status  Activity Tolerance: Patient tolerated treatment well Patient left: in chair;with call bell/phone within reach;with family/visitor present  OT Visit Diagnosis: Dizziness and giddiness (R42)                Time: 4854-6270 OT Time Calculation (min): 24 min Charges:  OT General Charges $OT Visit: 1 Visit OT Evaluation $OT Eval Low Complexity: 1 Low OT Treatments $Self Care/Home Management : 8-22 mins  Rejeana Brock, MS, OTR/L ascom [phone] 11/01/20, 10:56 AM

## 2020-11-01 NOTE — Progress Notes (Signed)
Neurology Progress Note  Patient ID: Courtney Simpson is a 44 y.o. with PMHx of  has a past medical history of Arthritis, Fatty liver, GERD (gastroesophageal reflux disease), Hyperlipidemia, Hypertension, and Sarcoidosis.  As well as degenerative disc disease of the spine, status post 3 surgeries  Initially consulted for: Code stroke for sudden onset vision loss  Major interval events:  MRI brain negative for stroke  Subjective: Significant additional history was obtained from the patient today, as well as record reviews from ophthalmology and GI. 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 There was concern for ongoing active inflammation and there was a plan for starting prednisone or possibly Humira; while patient/mother initially reported history of optic nerve inflammation, on further record review only uveitis is noted (as well as pulmonary nodules). On follow-up with her bariatric surgeon who (gastric sleeve operation completed in May 2021), she was instructed to avoid steroids. She has continued on her methotrexate 25 mg weekly and takes her folate and reports compliance with her bariatric vitamins as well. In the last few weeks to months she has had a worsening headache, which is associated with pulsatile tinnitus when she lays down, better when she is standing up, wakes her from sleep sometimes, reported vertical and tangential skew deviation yesterday, worse with Valsalva, and associated with the development of her blurry vision.  She has also been having intermittent double vision and a loss of vision as noted yesterday.  She reports she has lost about 40 pounds since her bariatric surgery.  She reports she has not been having current GI bleeding but she has had a history of ulcer.  Exam: Vitals:   11/01/20 1020 11/01/20 1141  BP: (!) 150/101 132/89  Pulse: 77 83  Resp: 16 16  Temp:  98.1 F (36.7 C)  SpO2: 96% 96%    Physical Exam  Constitutional: Appears well-developed and well-nourished. Cushingoid Psych: Affect appropriate to situation Eyes: No scleral injection HENT: No OP obstruction, good dentition MSK: no joint deformities.  Cardiovascular: Normal rate and regular rhythm.  Respiratory: Effort normal, non-labored breathing GI: Soft.  No distension. There is no tenderness. obese Skin: WDI Back: Dorsal cervical fat pad is prominent. C-spine tenderness to palpation, acute on chronic per patient. Lumbar landmarks difficult to palpate.  Neuro: Mental Status: Patient is awake, alert, oriented to person, place, month, year, and situation. Patient is able to give a clear and coherent history. No signs of aphasia or neglect Cranial Nerves: II: Visual Fields are full. Pupils are equal, round, and reactive to light.   III,IV, VI: EOMI without ptosis but marked diplopia on lateral gaze, L > R V: Facial sensation is symmetric to temperature VII: Facial movement is symmetric.  VIII: hearing is intact to voice X: Uvula elevates symmetrically XI: Shoulder shrug is symmetric. XII: tongue is midline without atrophy or fasciculations.  Motor: Tone is normal. Bulk is normal. 5/5 strength was present in all four extremities.  Sensory: There is an area of sensory loss in the anterior thigh including knee, including loss of vibration and cold temperature Deep Tendon Reflexes: 3+ and symmetric in the biceps, brachioradialis, and patellae, + hoffman's bilaterally, negative clonus Plantars: Toes are downgoing bilaterally.  Cerebellar: FNF and HKS are intact bilaterally Gait:  Able to rise on heels and toes, able to tandem. Casual gait favors the left leg slightly   Pertinent Labs:  Assessment: This is a 44 year old immunocompromised (sarcoidosis on  methotrexate) patient with a history of uveitis and concern for ongoing inflammation from her sarcoidosis per ophthalmology, presenting with a subacute  worsening headache and dizziness.  There was an acute change in vision that was concerning for potential stroke which has been ruled out with MRI.  However her headache history reveals many alarm features (positional allergy, pulsatile tinnitus, diplopia, transient vision changes, immunocompromise status).  Therefore she requires urgent inpatient work-up   Impression:  High CSF pressure headache  DDx: Infection, autoimmune inflammation, IH (the latter is less likely given she has been losing weight post bariatric surgery), cerebral venous thrombosis Left leg weakness  DDx: Lumbosacral radiculopathy versus spinal cord or nerve root inflammation/infection  Recommendations:  Diagnostic studies -MRV with contrast to rule out cerebral venous thrombosis -MRI C, T and L-spine with and without contrast -Appreciate radiology assistance with lumbar puncture given patient's BMI  -CSF studies sent: Cell counts, protein, glucose, Gram stain, VDRL, fungal culture, cryptococcal antigen, VZV PCR, VZV IgG, HSV PCR, oligoclonal bands,  Headache management -Avoid dexamethasone (history of GI ulcer, recent bariatric surgery) -We will try migraine cocktails of Benadryl 25 mg, Compazine 5 mg, Tylenol 650 mg as needed every 6 hours pending further work-up  Brooke Dare MD-PhD [facility] [phone]

## 2020-11-02 DIAGNOSIS — G4489 Other headache syndrome: Secondary | ICD-10-CM

## 2020-11-02 DIAGNOSIS — G932 Benign intracranial hypertension: Secondary | ICD-10-CM

## 2020-11-02 MED ORDER — ACETAZOLAMIDE 250 MG PO TABS
250.0000 mg | ORAL_TABLET | Freq: Two times a day (BID) | ORAL | Status: DC
Start: 2020-11-02 — End: 2020-11-02

## 2020-11-02 NOTE — Progress Notes (Addendum)
Neurology Progress Note  Patient ID: Courtney Simpson is a 44 y.o. with PMHx of  has a past medical history of Arthritis, Fatty liver, GERD (gastroesophageal reflux disease), Hyperlipidemia, Hypertension, and Sarcoidosis.  As well as degenerative disc disease of the spine, status post 3 surgeries  Initially consulted for: Code stroke for sudden onset vision loss  Major interval events:  MRI brain negative for stroke Due to severe DDD LP even under fluoro was technically challenging. Opening pressure not felt to be reliable per Dr. Kerby Nora   Subjective: Significant additional history was obtained from the patient today, as well as record reviews from ophthalmology and GI. 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 There was concern for ongoing active inflammation and there was a plan for starting prednisone or possibly Humira; while patient/mother initially reported history of optic nerve inflammation, on further record review only uveitis is noted (as well as pulmonary nodules). On follow-up with her bariatric surgeon who (gastric sleeve operation completed in May 2021), she was instructed to avoid steroids. She has continued on her methotrexate 25 mg weekly and takes her folate and reports compliance with her bariatric vitamins as well. In the last few weeks to months she has had a worsening headache, which is associated with pulsatile tinnitus when she lays down, better when she is standing up, wakes her from sleep sometimes, reported vertical and tangential skew deviation yesterday, worse with Valsalva, and associated with the development of her blurry vision.  She has also been having intermittent double vision and a loss of vision as noted yesterday.  She reports she has lost about 40 pounds since her bariatric surgery.  She reports she has not been having current GI bleeding but she has had a history of ulcer.  Exam: Vitals:    11/02/20 0402 11/02/20 0749  BP: 126/89 130/82  Pulse: 74 73  Resp: 16 18  Temp: 98.1 F (36.7 C) 98.7 F (37.1 C)  SpO2: 98% 97%   Physical Exam  Constitutional: Appears well-developed and well-nourished. Cushingoid Psych: Affect appropriate to situation Eyes: No scleral injection HENT: No OP obstruction, good dentition MSK: no joint deformities.  Cardiovascular: Perfusing extremities well Respiratory: Effort normal, non-labored breathing GI: Soft.  No distension. obese  Neuro: Mental Status: Patient is awake, alert, oriented to person, place, month, year, and situation. Cranial Nerves: II: Visual Fields are full. Pupils are equal, round, and reactive to light. Grade I papilledema bilaterally III,IV, VI: EOMI without ptosis but continues to have diplopia on lateral gaze, L > R VII: Facial movement is symmetric.  VIII: hearing is intact to voice XII: tongue is midline without atrophy or fasciculations.  Motor: Moving all extremities freely antigravity Sensory: There is an area of sensory loss in the anterior thigh including knee, including loss of vibration and cold temperature Deep Tendon Reflexes: 3+ and symmetric in the biceps, brachioradialis, and patellae, + hoffman's bilaterally, negative clonus Plantars: Toes are downgoing bilaterally.  Cerebellar: FNF and HKS are intact bilaterally Gait:  Able to rise on heels and toes, able to tandem. Casual gait favors the left leg slightly   MRV with contrast to rule out cerebral venous thrombosis  Personally reviewed, no evidence of CVST  MRI C, T and L-spine with and without contrast  Personally reviewed, no acute process   Pertinent Labs: HIV negative CSF: (1 vs. 4 mislabled)   3975 -> 425 RBC  31 -> 3 WBC  Protein 32  Glucose 69 (serum range WNL)  Gram stain negative   Assessment: This is a 44 year old immunocompromised (sarcoidosis on methotrexate) patient with a history of uveitis and concern for ongoing  inflammation from her sarcoidosis per ophthalmology, presenting with a subacute worsening headache and dizziness.  There was an acute change in vision that was concerning for potential stroke which has been ruled out with MRI.  However her headache history reveals many alarm features (positional allergy, pulsatile tinnitus, diplopia, transient vision changes, immunocompromise status). Unfortunately, her OP was unreliable due to challenging LP. Given history and exam findings, will treat empirically for idiopathic intracranial hypertension (IIH), with the understanding that gold standard measurement of CSF pressure will be difficult to obtain for this patient and she is experiencing potentially vision threatening symptoms.  Patient has a chart documented allergy to sulfa, specifically sulfa antibiotics with rash.  Per review of Lexi comp, there is actually unlikely to be a severe cross reaction to nonantibiotic sulfonamides (see relevant text below).  Discussed at length with the patient, and she reports that her rash was minor if present and she hardly recalls the incident, and is certain that it was not severe.  Additionally, the alternative medication would be topiramate, and she reports that this medication was intolerable for her due to severe brain fog (a common side effect of topiramate).  Therefore we will proceed with administering a dose of Diamox in the hospital, observe the patient for a minimum of 2 hours after the dose is given to confirm she is tolerating it.  Additionally she is get been given strict return precautions to take any rash seriously and have it medically evaluated should one arise later.  ". Sulfonamide ("sulfa") allergy: The FDA-approved product labeling for many medications containing a sulfonamide chemical group includes a broad contraindication in patients with a prior allergic reaction to sulfonamides. There is a potential for cross-reactivity between members of a specific class  (eg, two antibiotic sulfonamides). However, concerns for cross-reactivity have previously extended to all compounds containing the sulfonamide structure (SO2NH2). An expanded understanding of allergic mechanisms indicates cross-reactivity between antibiotic sulfonamides and nonantibiotic sulfonamides may not occur or at the very least this potential is extremely low Milagros Reap 2004; Laural Benes 2005; Slatore 2004; Tornero 2004). In particular, mechanisms of cross-reaction due to antibody production (anaphylaxis) are unlikely to occur with nonantibiotic sulfonamides. T-cell-mediated (type IV) reactions (eg, maculopapular rash) are less well understood and it is not possible to completely exclude this potential based on current insights. In cases where prior reactions were severe (Stevens-Johnson syndrome/TEN), some clinicians choose to avoid exposure to these classes."   Impression:  High CSF pressure headache  Infection ruled out with lumbar puncture, given bland CSF additionally unlikely to be sarcoidosis though this remains formally possibility Left leg weakness  DDx: Lumbosacral radiculopathy versus spinal cord or nerve root inflammation/infection  MRI reassuring, suspect anterior cutaneous femoral nerve irritation based on distribution of sensory change; weakness is likely secondary to her chronic back issues  Recommendations:  #Suspected IIH -Diamox 250 mg twice daily, to be uptitrated by her outpatient neurologist based on tolerability -Patient reports she will arrange her own close outpatient neurology follow-up with her prior neurologist -Studies to be followed up by neurologist include -CSF studies sent: VDRL, fungal culture, cryptococcal antigen, VZV PCR, VZV IgG, HSV PCR, oligoclonal bands,  #Headache management -Diamox and weight loss will help the underlying etiology as above, in the interim cautious use of cocktails as below is appropriate -Avoid dexamethasone (  history of GI ulcer,  recent bariatric surgery) -We will try migraine cocktails of Benadryl 25 mg, Compazine 5 mg, Tylenol 650 mg as needed every 6 hours pending further work-up  Discharge instructions: Please arrange follow-up with your ophthalmologist for funduscopic exam and visual field testing within the next 2 to 4 weeks Please arrange follow-up with your neurologist to discuss your headaches and Diamox (you will likely need to increase the dose further if you are tolerating it well) in the next 2 to 4 weeks.  Several of your CSF studies have not yet resulted and your neurologist should be able to follow-up on these results with you as well (VDRL, fungal culture, cryptococcal antigen, VZV PCR, VZV IgG, HSV PCR, oligoclonal bands)  Remember we discussed the side effects of Diamox, be sure to remain well-hydrated and be cautious around excessive heat.  Given your allergy to sulfa medications, which you reported was a mild rash to sulfa antibiotics, please take any rashes seriously and have it professionally evaluated as quickly as possible.  Limit use of headache cocktails to no more than 3-4 doses per week -Tylenol 650 mg up to every 8 hours  -Compazine 5 to 10 mg every 8 hours,  -Benadryl 12.5 to 25 mg every 8 hours.   -Please drink with liberal amounts of water, aim to urinate 6-8 times a day, and eat regular meals with normal salt intake -Please keep a headache log to discuss with your neurologist on follow-up -Lifestyle measures that help reduce headaches including weight loss, stress management, regular meals, regular sleep, regular aerobic exercise, and adequate fluid intake to achieve 6-8 urinations per day.  Diet can also play a role and patient is encouraged to track potential dietary triggers  40 minutes were spent in the care of this patient today, greater than 50% at bedside given complex decision-making around Diamox versus Topamax and discharge planning  Brooke Dare MD-PhD [facility] [phone]

## 2020-11-02 NOTE — Discharge Summary (Signed)
Physician Discharge Summary   Courtney Simpson  female DOB: Dec 12, 1975  YHC:[id number]  PCP: Lynnea Ferrier, MD  Admit date: 10/31/2020 Discharge date: 11/02/2020  Admitted From: home Disposition:  home CODE STATUS: Full code  Discharge Instructions    Discharge instructions   Complete by: As directed    Discharge instructions from Neurology: Please arrange follow-up with your ophthalmologist for funduscopic exam and visual field testing within the next 2 to 4 weeks Please arrange follow-up with your neurologist to discuss your headaches and Diamox (you will likely need to increase the dose further if you are tolerating it well) in the next 2 to 4 weeks.  Several of your CSF studies have not yet resulted and your neurologist should be able to follow-up on these results with you as well (VDRL, fungal culture, cryptococcal antigen, VZV PCR, VZV IgG, HSV PCR, oligoclonal bands)  Remember we discussed the side effects of Diamox, be sure to remain well-hydrated and be cautious around excessive heat.  Given your allergy to sulfa medications, which you reported was a mild rash to sulfa antibiotics, please take any rashes seriously and have it professionally evaluated as quickly as possible.  Limit use of headache cocktails to no more than 3-4 doses per week -Tylenol 650 mg up to every 8 hours  -Compazine 5 to 10 mg every 8 hours,  -Benadryl 12.5 to 25 mg every 8 hours.   -Please drink with liberal amounts of water, aim to urinate 6-8 times a day, and eat regular meals with normal salt intake -Please keep a headache log to discuss with your neurologist on follow-up -Lifestyle measures that help reduce headaches including weight loss, stress management, regular meals, regular sleep, regular aerobic exercise, and adequate fluid intake to achieve 6-8 urinations per day.  Diet can also play a role and patient is encouraged to track potential dietary triggers       Hospital Course:  For  full details, please see H&P, progress notes, consult notes and ancillary notes.  Briefly,  Courtney Simpson is a 44 y.o.femalewith medical history significant forsarcoidosis involving the lung diagnosed in 2008and associated uveitis, B12 deficiency, status post bariatric surgery - bypass per patient, history of marginal ulcer, erosive gastritis4/08/2016, depression/anxiety, h/o pseudoseizure hospitalized at [facility], diverticulosis, obesity, sleep apneain 2007and does not wear a cpap maskanymore, presented to the ED for chief concern ofheadache, dizziness, feeling faint,and tingling of her left extremities.   High CSF pressure headache Neurology consulted.  Patient's imaging survey negative for acute stroke.   Infection ruled out with lumbar puncture, given bland CSF additionally unlikely to be sarcoidosis though this remains formally possibility.  MR venogram head, MR cervical/thoracic/lumbar spine without acute finding.  Pt was started on Diamox 250 mg twice daily, to be uptitrated by her outpatient neurologist based on tolerability.  Patient said she will arrange her own close outpatient neurology follow-up with her prior neurologist.  For headache management, pt was advised to avoid dexamethasone, and was prescribed migraine cocktail and tylenol at discharge.  Blurred vision Pt advised to follow-up with outpatient ophthalmologist for funduscopic exam and visual field testing within the next 2 to 4 weeks.  Weakness Lumbosacral radiculopathy versus spinal cord or nerve root inflammation/infection Per neurology, MRI reassuring, suspect anterior cutaneous femoral nerve irritation based on distribution of sensory change; weakness is likely secondary to her chronic back issues.  Pulmonary sarcoidosis History of uveitis Chronic immunosuppression Patient on weekly methotrexate injections  Depression/anxiety Continued home regimen of venlafaxine  150 mg XR capsule daily with breakfast,  fluoxetine 40 mg capsules daily, Abilify 5 mg daily, sertraline 100 mgBID  Sleep apnea patient not wearing CPAP.   extensive discussion with patient regarding the importance of wearing CPAP machine  GERD  protonix 40 mg daily   Discharge Diagnoses:  Principal Problem:   Stroke [facility]) Active Problems:   Morbid obesity (HCC)   Sarcoidosis of lung (HCC)   Sarcoidosis   Uveitis   Obesity (BMI 30-39.9)   Depression   Sleep apnea in adult   Complicated headache syndromes    Discharge Instructions:  Allergies as of 11/02/2020      Reactions   Eggs Or Egg-derived Products Rash   Egg whites   Other Anaphylaxis   Nuts   Adhesive [tape] Dermatitis   Blisters   Septra [sulfamethoxazole-trimethoprim] Rash   Sulfa Antibiotics Rash   Vancomycin Rash      Medication List    STOP taking these medications   azithromycin 250 MG tablet Commonly known as: ZITHROMAX   methotrexate 2.5 MG tablet Commonly known as: RHEUMATREX   nystatin powder Generic drug: nystatin   predniSONE 10 MG tablet Commonly known as: DELTASONE   sucralfate 1 g tablet Commonly known as: CARAFATE   Vitamin D (Ergocalciferol) 1.25 MG (50000 UNIT) Caps capsule Commonly known as: DRISDOL     TAKE these medications   acetaZOLAMIDE 250 MG tablet Commonly known as: DIAMOX Take 1 tablet (250 mg total) by mouth 2 (two) times daily.   ARIPiprazole 5 MG tablet Commonly known as: ABILIFY Take 5 mg by mouth daily.   citalopram 20 MG tablet Commonly known as: CELEXA Take 20 mg by mouth daily.   cyanocobalamin 1000 MCG/ML injection Commonly known as: (VITAMIN B-12) INJECT 1 ML (1000 MCG) INTO THE MUSCLE MONTHLY   diphenhydrAMINE 25 mg capsule Commonly known as: BENADRYL Take 1 capsule (25 mg total) by mouth every 8 (eight) hours as needed (headache).   FLUoxetine 40 MG capsule Commonly known as: PROZAC Take 40 mg by mouth daily.   folic acid 1 MG tablet Commonly known as: FOLVITE Take 1  mg by mouth daily.   levonorgestrel 20 MCG/24HR IUD Commonly known as: MIRENA 1 each by Intrauterine route once.   methotrexate 250 MG/10ML injection Inject 25 mg into the skin once a week. Thursday   pantoprazole 40 MG tablet Commonly known as: PROTONIX Take 40 mg by mouth daily.   prochlorperazine 5 MG tablet Commonly known as: COMPAZINE Take 1 tablet (5 mg total) by mouth every 8 (eight) hours as needed (headache).   sertraline 100 MG tablet Commonly known as: ZOLOFT Take 100 mg by mouth 2 (two) times daily.   topiramate 100 MG tablet Commonly known as: TOPAMAX Pt reported not taking. What changed:   how much to take  how to take this  when to take this  additional instructions   venlafaxine XR 150 MG 24 hr capsule Commonly known as: EFFEXOR-XR Take 150 mg by mouth daily.        Follow-up Information    Your ophthalmologist Follow up in 2 week(s).   Why: for funduscopic exam and visual field testing       Your neurologist Follow up in 2 week(s).               Allergies  Allergen Reactions  . Eggs Or Egg-Derived Products Rash    Egg whites  . Other Anaphylaxis    Nuts  . Adhesive [Tape] Dermatitis  Blisters  . Septra [Sulfamethoxazole-Trimethoprim] Rash  . Sulfa Antibiotics Rash  . Vancomycin Rash     The results of significant diagnostics from this hospitalization (including imaging, microbiology, ancillary and laboratory) are listed below for reference.   Consultations:   Procedures/Studies: CT Code Stroke CTA Head W/WO contrast  Result Date: 10/31/2020 CLINICAL DATA:  Right visual field defect. Occipital stroke shown by head CT. EXAM: CT ANGIOGRAPHY HEAD AND NECK CT PERFUSION BRAIN TECHNIQUE: Multidetector CT imaging of the head and neck was performed using the standard protocol during bolus administration of intravenous contrast. Multiplanar CT image reconstructions and MIPs were obtained to evaluate the vascular anatomy. Carotid  stenosis measurements (when applicable) are obtained utilizing NASCET criteria, using the distal internal carotid diameter as the denominator. Multiphase CT imaging of the brain was performed following IV bolus contrast injection. Subsequent parametric perfusion maps were calculated using RAPID software. CONTRAST:  OMNIPAQUE IOHEXOL 350 MG/ML SOLN COMPARISON:  Head CT earlier same day FINDINGS: CT HEAD FINDINGS CTA NECK FINDINGS Aortic arch: Normal.  No atherosclerotic change. Right carotid system: Normal.  No atherosclerotic change. Left carotid system: Common carotid artery widely patent. There is some soft and calcified plaque at the proximal ICA bulb on the left but without measurable stenosis. Cervical ICA widely patent beyond that. Vertebral arteries: Both vertebral artery origins are widely patent. Both vertebral arteries appear normal through the cervical region to the foramen magnum. Skeleton: Previous ACDF. Other neck: No neck mass or lymphadenopathy.  Normal Upper chest: Normal Review of the MIP images confirms the above findings CTA HEAD FINDINGS Anterior circulation: Both internal carotid arteries widely patent through the skull base and siphon regions. The anterior and middle cerebral vessels are normal without large or medium vessel occlusion or proximal stenosis. Posterior circulation: Both vertebral arteries widely patent to the basilar. No basilar stenosis. Posterior circulation branch vessels appear normal. This includes the left PCA which appears presently normal. I think there is a tiny posterior communicating artery on each side. Venous sinuses: Patent and normal. Anatomic variants: None significant. Review of the MIP images confirms the above findings CT Brain Perfusion Findings: ASPECTS: 10 CBF (<30%) Volume: 69mL Perfusion (Tmax>6.0s) volume: 35mL Mismatch Volume: 24mL Infarction Location:None by perfusion.  Left occipital by CT. IMPRESSION: 1. Negative acute evaluation. No large or  medium vessel occlusion. Normal perfusion study. Left occipital stroke is probably subacute, with recanalization of the left PCA and pseudo normalization of flow and perfusion parameters at this time. 2. Mild atherosclerotic disease at the left ICA bulb but without measurable stenosis. Electronically Signed   By: Paulina Fusi M.D.   On: 10/31/2020 19:00   CT Code Stroke CTA Neck W/WO contrast  Result Date: 10/31/2020 CLINICAL DATA:  Right visual field defect. Occipital stroke shown by head CT. EXAM: CT ANGIOGRAPHY HEAD AND NECK CT PERFUSION BRAIN TECHNIQUE: Multidetector CT imaging of the head and neck was performed using the standard protocol during bolus administration of intravenous contrast. Multiplanar CT image reconstructions and MIPs were obtained to evaluate the vascular anatomy. Carotid stenosis measurements (when applicable) are obtained utilizing NASCET criteria, using the distal internal carotid diameter as the denominator. Multiphase CT imaging of the brain was performed following IV bolus contrast injection. Subsequent parametric perfusion maps were calculated using RAPID software. CONTRAST:  OMNIPAQUE IOHEXOL 350 MG/ML SOLN COMPARISON:  Head CT earlier same day FINDINGS: CT HEAD FINDINGS CTA NECK FINDINGS Aortic arch: Normal.  No atherosclerotic change. Right carotid system: Normal.  No atherosclerotic change.  Left carotid system: Common carotid artery widely patent. There is some soft and calcified plaque at the proximal ICA bulb on the left but without measurable stenosis. Cervical ICA widely patent beyond that. Vertebral arteries: Both vertebral artery origins are widely patent. Both vertebral arteries appear normal through the cervical region to the foramen magnum. Skeleton: Previous ACDF. Other neck: No neck mass or lymphadenopathy.  Normal Upper chest: Normal Review of the MIP images confirms the above findings CTA HEAD FINDINGS Anterior circulation: Both internal carotid arteries  widely patent through the skull base and siphon regions. The anterior and middle cerebral vessels are normal without large or medium vessel occlusion or proximal stenosis. Posterior circulation: Both vertebral arteries widely patent to the basilar. No basilar stenosis. Posterior circulation branch vessels appear normal. This includes the left PCA which appears presently normal. I think there is a tiny posterior communicating artery on each side. Venous sinuses: Patent and normal. Anatomic variants: None significant. Review of the MIP images confirms the above findings CT Brain Perfusion Findings: ASPECTS: 10 CBF (<30%) Volume: 0mL Perfusion (Tmax>6.0s) volume: 0mL Mismatch Volume: 0mL Infarction Location:None by perfusion.  Left occipital by CT. IMPRESSION: 1. Negative acute evaluation. No large or medium vessel occlusion. Normal perfusion study. Left occipital stroke is probably subacute, with recanalization of the left PCA and pseudo normalization of flow and perfusion parameters at this time. 2. Mild atherosclerotic disease at the left ICA bulb but without measurable stenosis. Electronically Signed   By: Paulina FusiMark  Shogry M.D.   On: 10/31/2020 19:00   MR BRAIN W WO CONTRAST  Result Date: 10/31/2020 CLINICAL DATA:  Dizziness of sudden onset. Near syncope. Blurred vision. Double vision. Occipital abnormality on the left by CT but negative CTA and perfusion study. EXAM: MRI HEAD WITHOUT AND WITH CONTRAST TECHNIQUE: Multiplanar, multiecho pulse sequences of the brain and surrounding structures were obtained without and with intravenous contrast. CONTRAST:  7.625mL GADAVIST GADOBUTROL 1 MMOL/ML IV SOLN COMPARISON:  CT studies earlier same day FINDINGS: Brain: The brain has a normal appearance without evidence of malformation, atrophy, old or acute small or large vessel infarction, mass lesion, hemorrhage, hydrocephalus or extra-axial collection. After contrast administration, no abnormal enhancement occurs. There is no  abnormality in the left occipital lobe. Therefore, though initially rather convincing by CT, the finding was artifactual. Vascular: Major vessels at the base of the brain show flow. Venous sinuses appear patent. Skull and upper cervical spine: Normal. Sinuses/Orbits: Clear/normal. Other: None significant. IMPRESSION: Normal brain MRI. No abnormality seen to explain the presenting symptoms. The left occipital lobe is normal. Though initially rather convincing by CT, the finding was artifactual. Electronically Signed   By: Paulina FusiMark  Shogry M.D.   On: 10/31/2020 21:12   MR CERVICAL SPINE W WO CONTRAST  Result Date: 11/01/2020 CLINICAL DATA:  History of pulmonary sarcoid. Headache, dizziness and tingling of the left arm and leg. Negative brain imaging. EXAM: MRI CERVICAL SPINE WITHOUT AND WITH CONTRAST TECHNIQUE: Multiplanar and multiecho pulse sequences of the cervical spine, to include the craniocervical junction and cervicothoracic junction, were obtained without and with intravenous contrast. CONTRAST:  10mL GADAVIST GADOBUTROL 1 MMOL/ML IV SOLN COMPARISON:  10/27/2017 FINDINGS: Alignment: Normal Vertebrae: Previous ACDF C5 through C7 has a good appearance. Cord: No cord compression or primary cord lesion. Posterior Fossa, vertebral arteries, paraspinal tissues: Normal Disc levels: Foramen magnum is widely patent.  C1-2 and C2-3 are normal. C3-4: Minimal noncompressive disc bulge. C4-5: Minimal uncovertebral prominence and noncompressive disc bulge. C5 through C7: Previous  ACDF has a good appearance with wide patency of the canal and foramina. C7-T1: Shallow left paracentral disc protrusion indents the thecal sac but does not affect cord or appear to cause compressive foraminal narrowing. Some potential this could irritate the left C8 nerve, but frank nerve compression is not present. This has worsened slightly since October 2020. IMPRESSION: 1. Good appearance in the fusion segment from C5 through C7. 2. Slight  worsening of a shallow left paracentral disc protrusion at C7-T1 since the study of 2020. This indents the thecal sac but does not affect the cord or appear to cause foraminal narrowing. Some potential this could irritate the left C8 nerve, but frank nerve compression is not present. Electronically Signed   By: Paulina Fusi M.D.   On: 11/01/2020 20:28   MR THORACIC SPINE W WO CONTRAST  Result Date: 11/01/2020 CLINICAL DATA:  Sarcoid.  Mid back pain.  Left arm and leg pain. EXAM: MRI THORACIC WITHOUT AND WITH CONTRAST TECHNIQUE: Multiplanar and multiecho pulse sequences of the thoracic spine were obtained without and with intravenous contrast. CONTRAST:  10mL GADAVIST GADOBUTROL 1 MMOL/ML IV SOLN COMPARISON:  None. FINDINGS: MRI THORACIC SPINE FINDINGS Alignment:  Normal Vertebrae: Normal Cord: Normal. No cord compression or primary cord lesion. No abnormal enhancement. Paraspinal and other soft tissues: Normal Disc levels: No thoracic disc pathology. No degeneration, bulge or herniation. No facet arthropathy. IMPRESSION: Normal examination of the thoracic spine. Electronically Signed   By: Paulina Fusi M.D.   On: 11/01/2020 20:33   MR Lumbar Spine W Wo Contrast  Result Date: 11/01/2020 CLINICAL DATA:  Low back pain.  History of sarcoid.  Left leg pain. EXAM: MRI LUMBAR SPINE WITHOUT AND WITH CONTRAST TECHNIQUE: Multiplanar and multiecho pulse sequences of the lumbar spine were obtained without and with intravenous contrast. CONTRAST:  10mL GADAVIST GADOBUTROL 1 MMOL/ML IV SOLN COMPARISON:  07/24/2013 FINDINGS: Segmentation:  5 lumbar type vertebral bodies. Alignment:  Normal Vertebrae:  Normal Conus medullaris and cauda equina: Conus extends to the L2 level. Conus and cauda equina appear normal. Paraspinal and other soft tissues: Normal Disc levels: No abnormality from T10-11 through L1-2. L2-3: Desiccation and mild bulging of the disc. No compressive stenosis. Worsening since 2014. L3-4: Mild bulging of  the disc. Mild facet and ligamentous prominence. No compressive stenosis. Mild worsening since 2014. L4-5: Previous right hemilaminectomy and discectomy. Bulging of the disc slightly more prominent towards the right with slight caudal down turning. Improved appearance compared to the study of 2014. No apparent compressive stenosis. L5-S1: No disc abnormality. Mild facet osteoarthritis. No stenosis. IMPRESSION: 1. L2-3 and L3-4: Mild bulging of the discs. Mild facet and ligamentous prominence. No compressive stenosis. Mild worsening since 2014. 2. L4-5: Previous right hemilaminectomy and discectomy. Bulging of the disc slightly more prominent towards the right with slight caudal down turning. No apparent compressive stenosis. Improved appearance compared to the study of 2014. 3. L5-S1: Mild facet osteoarthritis. No stenosis. Electronically Signed   By: Paulina Fusi M.D.   On: 11/01/2020 20:31   CT Code Stroke Cerebral Perfusion with contrast  Result Date: 10/31/2020 CLINICAL DATA:  Right visual field defect. Occipital stroke shown by head CT. EXAM: CT ANGIOGRAPHY HEAD AND NECK CT PERFUSION BRAIN TECHNIQUE: Multidetector CT imaging of the head and neck was performed using the standard protocol during bolus administration of intravenous contrast. Multiplanar CT image reconstructions and MIPs were obtained to evaluate the vascular anatomy. Carotid stenosis measurements (when applicable) are obtained utilizing NASCET criteria, using  the distal internal carotid diameter as the denominator. Multiphase CT imaging of the brain was performed following IV bolus contrast injection. Subsequent parametric perfusion maps were calculated using RAPID software. CONTRAST:  OMNIPAQUE IOHEXOL 350 MG/ML SOLN COMPARISON:  Head CT earlier same day FINDINGS: CT HEAD FINDINGS CTA NECK FINDINGS Aortic arch: Normal.  No atherosclerotic change. Right carotid system: Normal.  No atherosclerotic change. Left carotid system: Common  carotid artery widely patent. There is some soft and calcified plaque at the proximal ICA bulb on the left but without measurable stenosis. Cervical ICA widely patent beyond that. Vertebral arteries: Both vertebral artery origins are widely patent. Both vertebral arteries appear normal through the cervical region to the foramen magnum. Skeleton: Previous ACDF. Other neck: No neck mass or lymphadenopathy.  Normal Upper chest: Normal Review of the MIP images confirms the above findings CTA HEAD FINDINGS Anterior circulation: Both internal carotid arteries widely patent through the skull base and siphon regions. The anterior and middle cerebral vessels are normal without large or medium vessel occlusion or proximal stenosis. Posterior circulation: Both vertebral arteries widely patent to the basilar. No basilar stenosis. Posterior circulation branch vessels appear normal. This includes the left PCA which appears presently normal. I think there is a tiny posterior communicating artery on each side. Venous sinuses: Patent and normal. Anatomic variants: None significant. Review of the MIP images confirms the above findings CT Brain Perfusion Findings: ASPECTS: 10 CBF (<30%) Volume: 0mL Perfusion (Tmax>6.0s) volume: 0mL Mismatch Volume: 0mL Infarction Location:None by perfusion.  Left occipital by CT. IMPRESSION: 1. Negative acute evaluation. No large or medium vessel occlusion. Normal perfusion study. Left occipital stroke is probably subacute, with recanalization of the left PCA and pseudo normalization of flow and perfusion parameters at this time. 2. Mild atherosclerotic disease at the left ICA bulb but without measurable stenosis. Electronically Signed   By: Paulina Fusi M.D.   On: 10/31/2020 19:00   MR MRV HEAD W WO CONTRAST  Result Date: 11/01/2020 CLINICAL DATA:  Dizziness.  Near syncope.  Blurred vision. EXAM: MR VENOGRAM HEAD WITHOUT AND WITH CONTRAST TECHNIQUE: Angiographic images of the intracranial venous  structures were obtained using MRV technique without and with intravenous contrast. CONTRAST:  10mL GADAVIST GADOBUTROL 1 MMOL/ML IV SOLN COMPARISON:  Contrast enhanced brain MRI done yesterday. FINDINGS: Superior sagittal sinus is widely patent and normal. Right transverse sinus is dominant, widely patent and normal. Left transverse sinus is small but patent. Sigmoid sinuses are patent. Jugular veins are patent. Deep venous system appears normal. No evidence of superficial thrombosis. IMPRESSION: Normal venous examination. No abnormality seen to explain the clinical presentation. Electronically Signed   By: Paulina Fusi M.D.   On: 11/01/2020 20:36   CT HEAD CODE STROKE WO CONTRAST  Result Date: 10/31/2020 CLINICAL DATA:  Code stroke. Near syncope. Blurred vision. Visual field defect right upper. EXAM: CT HEAD WITHOUT CONTRAST TECHNIQUE: Contiguous axial images were obtained from the base of the skull through the vertex without intravenous contrast. COMPARISON:  06/14/2016 FINDINGS: Brain: Brainstem and cerebellum are normal. Probable early infarction in the left occipital lobe. The remainder the brain is normal. No sign of mass, hemorrhage, hydrocephalus or extra-axial collection. Vascular: Normal Skull: Normal Sinuses/Orbits: Normal Other: None ASPECTS (Alberta Stroke Program Early CT Score) - Ganglionic level infarction (caudate, lentiform nuclei, internal capsule, insula, M1-M3 cortex): 7 - Supraganglionic infarction (M4-M6 cortex): 3 Total score (0-10 with 10 being normal): 10 IMPRESSION: 1. Probable early infarction in the left occipital lobe. No  hemorrhage. 2. ASPECTS is 10. 3. These results were called by telephone at the time of interpretation on 10/31/2020 at 5:15 pm to provider Willy Eddy , who verbally acknowledged these results. Electronically Signed   By: Paulina Fusi M.D.   On: 10/31/2020 17:16   DG FL GUIDED LUMBAR PUNCTURE  Result Date: 11/01/2020 CLINICAL DATA:  Headache.  History  of sarcoidosis EXAM: DIAGNOSTIC LUMBAR PUNCTURE UNDER FLUOROSCOPIC GUIDANCE FLUOROSCOPY TIME:  Fluoroscopy Time:  30 seconds Radiation Exposure Index (if provided by the fluoroscopic device): 28.6 mGy Number of Acquired Spot Images: 4 PROCEDURE: Informed consent was obtained from the patient prior to the procedure, including potential complications of headache, allergy, and pain. With the patient prone, the lower back was prepped with Betadine and chlorhexidine. 1% Lidocaine was used for local anesthesia. Lumbar puncture was performed at the L3-4 level using a 22 gauge 5 inch needle. An opening pressure could not be obtained accurately in spite of multiple attempts and repositioning. Seventeen ml of pink tinged CSF were obtained for laboratory studies. The patient tolerated the procedure well and there were no apparent complications. IMPRESSION: Technically successful lumbar puncture. Electronically Signed   By: Meda Klinefelter MD   On: 11/01/2020 13:41      Labs: BNP (last 3 results) No results for input(s): BNP in the last 8760 hours. Basic Metabolic Panel: Recent Labs  Lab 10/31/20 1753  NA 139  K 3.2*  CL 101  CO2 27  GLUCOSE 96  BUN <5*  CREATININE 0.66  CALCIUM 8.4*   Liver Function Tests: Recent Labs  Lab 10/31/20 1753  AST 24  ALT 16  ALKPHOS 125  BILITOT 0.5  PROT 7.5  ALBUMIN 3.6   No results for input(s): LIPASE, AMYLASE in the last 168 hours. No results for input(s): AMMONIA in the last 168 hours. CBC: Recent Labs  Lab 10/31/20 1753 11/01/20 1424  WBC 11.9* 9.6  NEUTROABS 7.9* 8.6*  HGB 11.9* 12.0  HCT 37.6 37.3  MCV 84.9 84.0  PLT 437* 386   Cardiac Enzymes: No results for input(s): CKTOTAL, CKMB, CKMBINDEX, TROPONINI in the last 168 hours. BNP: Invalid input(s): POCBNP CBG: Recent Labs  Lab 10/31/20 1718  GLUCAP 88   D-Dimer No results for input(s): DDIMER in the last 72 hours. Hgb A1c Recent Labs    11/01/20 1117  HGBA1C 5.6   Lipid  Profile Recent Labs    11/01/20 1117  CHOL 190  HDL 61  LDLCALC 107*  TRIG 109  CHOLHDL 3.1   Thyroid function studies No results for input(s): TSH, T4TOTAL, T3FREE, THYROIDAB in the last 72 hours.  Invalid input(s): FREET3 Anemia work up No results for input(s): VITAMINB12, FOLATE, FERRITIN, TIBC, IRON, RETICCTPCT in the last 72 hours. Urinalysis    Component Value Date/Time   BILIRUBINUR neg 11/20/2015 1514   PROTEINUR neg 11/20/2015 1514   UROBILINOGEN 0.2 11/20/2015 1514   NITRITE neg 11/20/2015 1514   LEUKOCYTESUR Negative 11/20/2015 1514   Sepsis Labs Invalid input(s): PROCALCITONIN,  WBC,  LACTICIDVEN Microbiology Recent Results (from the past 240 hour(s))  Resp Panel by RT-PCR (Flu A&B, Covid) Nasopharyngeal Swab     Status: None   Collection Time: 10/31/20 11:21 PM   Specimen: Nasopharyngeal Swab; Nasopharyngeal(NP) swabs in vial transport medium  Result Value Ref Range Status   SARS Coronavirus 2 by RT PCR NEGATIVE NEGATIVE Final    Comment: (NOTE) SARS-CoV-2 target nucleic acids are NOT DETECTED.  The SARS-CoV-2 RNA is generally detectable in upper respiratory  specimens during the acute phase of infection. The lowest concentration of SARS-CoV-2 viral copies this assay can detect is 138 copies/mL. A negative result does not preclude SARS-Cov-2 infection and should not be used as the sole basis for treatment or other patient management decisions. A negative result may occur with  improper specimen collection/handling, submission of specimen other than nasopharyngeal swab, presence of viral mutation(s) within the areas targeted by this assay, and inadequate number of viral copies(<138 copies/mL). A negative result must be combined with clinical observations, patient history, and epidemiological information. The expected result is Negative.  Fact Sheet for Patients:  BloggerCourse.com  Fact Sheet for Healthcare Providers:   SeriousBroker.it  This test is no t yet approved or cleared by the [place] FDA and  has been authorized for detection and/or diagnosis of SARS-CoV-2 by FDA under an Emergency Use Authorization (EUA). This EUA will remain  in effect (meaning this test can be used) for the duration of the COVID-19 declaration under Section 564(b)(1) of the Act, 21 U.S.C.section 360bbb-3(b)(1), unless the authorization is terminated  or revoked sooner.       Influenza A by PCR NEGATIVE NEGATIVE Final   Influenza B by PCR NEGATIVE NEGATIVE Final    Comment: (NOTE) The Xpert Xpress SARS-CoV-2/FLU/RSV plus assay is intended as an aid in the diagnosis of influenza from Nasopharyngeal swab specimens and should not be used as a sole basis for treatment. Nasal washings and aspirates are unacceptable for Xpert Xpress SARS-CoV-2/FLU/RSV testing.  Fact Sheet for Patients: BloggerCourse.com  Fact Sheet for Healthcare Providers: SeriousBroker.it  This test is not yet approved or cleared by the [place] FDA and has been authorized for detection and/or diagnosis of SARS-CoV-2 by FDA under an Emergency Use Authorization (EUA). This EUA will remain in effect (meaning this test can be used) for the duration of the COVID-19 declaration under Section 564(b)(1) of the Act, 21 U.S.C. section 360bbb-3(b)(1), unless the authorization is terminated or revoked.  Performed at [facility], [address] [place], [place] [address]   CSF culture with Stat gram stain     Status: None (Preliminary result)   Collection Time: 11/01/20 12:54 PM   Specimen: CSF; Cerebrospinal Fluid  Result Value Ref Range Status   Specimen Description CSF  Final   Special Requests Immunocompromised  Final   Gram Stain   Final    NO ORGANISMS SEEN RBC'S SEEN NO WBC'S SEEN Performed at [facility], [address], [place],  [place] [address]    Culture PENDING  Incomplete   Report Status PENDING  Incomplete     Total time spend on discharging this patient, including the last patient exam, discussing the hospital stay, instructions for ongoing care as it relates to all pertinent caregivers, as well as preparing the medical discharge records, prescriptions, and/or referrals as applicable, is 45 minutes.    Darlin Priestly, MD  [facility] 11/02/2020, 12:32 PM

## 2021-03-10 DIAGNOSIS — I10 Essential (primary) hypertension: Secondary | ICD-10-CM | POA: Diagnosis not present

## 2021-03-10 DIAGNOSIS — M542 Cervicalgia: Secondary | ICD-10-CM | POA: Insufficient documentation

## 2021-03-10 DIAGNOSIS — M25511 Pain in right shoulder: Secondary | ICD-10-CM | POA: Diagnosis not present

## 2021-03-10 DIAGNOSIS — Y92410 Unspecified street and highway as the place of occurrence of the external cause: Secondary | ICD-10-CM | POA: Insufficient documentation

## 2021-03-10 DIAGNOSIS — I700 Atherosclerosis of aorta: Secondary | ICD-10-CM | POA: Diagnosis not present

## 2021-03-10 DIAGNOSIS — S0990XA Unspecified injury of head, initial encounter: Secondary | ICD-10-CM | POA: Insufficient documentation

## 2021-03-10 DIAGNOSIS — Z79899 Other long term (current) drug therapy: Secondary | ICD-10-CM | POA: Insufficient documentation

## 2021-03-10 DIAGNOSIS — R0789 Other chest pain: Secondary | ICD-10-CM | POA: Diagnosis not present

## 2021-03-10 DIAGNOSIS — K5730 Diverticulosis of large intestine without perforation or abscess without bleeding: Secondary | ICD-10-CM | POA: Insufficient documentation

## 2021-03-10 DIAGNOSIS — R079 Chest pain, unspecified: Secondary | ICD-10-CM

## 2021-03-10 DIAGNOSIS — I2510 Atherosclerotic heart disease of native coronary artery without angina pectoris: Secondary | ICD-10-CM | POA: Insufficient documentation
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
## 2021-03-10 NOTE — Discharge Instructions (Addendum)
Please use heat or ice for your muscular pain.  You may also use the muscle relaxant as prescribed.  You could also use Tylenol, up to 1000 mg 4 times daily as needed for pain.  Please follow-up with your primary care or pulmonologist regarding your cough.  Return to the ER for any worsening.

## 2021-08-18 NOTE — Telephone Encounter (Signed)
Pt is requesting a call from Annie's nurse she has questions about her current IUD; pt did not go in detail with questions. Please Advise.

## 2021-08-18 NOTE — Telephone Encounter (Signed)
Pt states intercourse/arousal has not been the same since having IUD put in, would like to discuss and has questions on hormones. Please advise.

## 2021-08-19 NOTE — Telephone Encounter (Signed)
Pt advised, pt to make appt.

## 2021-09-07 DIAGNOSIS — R350 Frequency of micturition: Secondary | ICD-10-CM | POA: Insufficient documentation

## 2021-09-07 DIAGNOSIS — R102 Pelvic and perineal pain: Secondary | ICD-10-CM | POA: Diagnosis not present

## 2021-09-07 DIAGNOSIS — F5231 Female orgasmic disorder: Secondary | ICD-10-CM

## 2021-09-07 NOTE — Progress Notes (Signed)
GYN ENCOUNTER NOTE  Subjective:       Courtney Simpson is a 45 y.o. G3P1001 female is here for gynecologic evaluation of the following issues:  1.pelvic pian for several weeks , states it feels like pressure, has concern about IUD placement. Requesting string check  2.IN ability to orgasm, states she has in the past but now is unable to    3.Urinary frequency, without UTI. She has been tested on multiple occasions and is negative UTI  Gynecologic History No LMP recorded (lmp unknown). (Menstrual status: IUD). Contraception: IUD Last Pap: 06/07/2017. Results were: normal/HPV negative Last mammogram: 09/06/2020. Results were: normal  Obstetric History OB History  Gravida Para Term Preterm AB Living  1 1 1     1   SAB IAB Ectopic Multiple Live Births          1    # Outcome Date GA Lbr Len/2nd Weight Sex Delivery Anes PTL Lv  1 Term 07/06/00   7 lb 12 oz (3.515 kg) M Vag-Spont  N LIV    Obstetric Comments  1st Menstrual Cycle:  10  1st Pregnancy:  24    Past Medical History:  Diagnosis Date   Arthritis    Fatty liver    GERD (gastroesophageal reflux disease)    Hyperlipidemia    Hypertension    Sarcoidosis     Past Surgical History:  Procedure Laterality Date   ANTERIOR CRUCIATE LIGAMENT REPAIR Bilateral    back surgery     CHOLECYSTECTOMY     COLONOSCOPY WITH PROPOFOL N/A 02/23/2016   Procedure: COLONOSCOPY WITH PROPOFOL;  Surgeon: 04/24/2016, MD;  Location: [facility] ENDOSCOPY;  Service: Endoscopy;  Laterality: N/A;   ESOPHAGOGASTRODUODENOSCOPY (EGD) WITH PROPOFOL N/A 02/23/2016   Procedure: ESOPHAGOGASTRODUODENOSCOPY (EGD) WITH PROPOFOL;  Surgeon: 04/24/2016, MD;  Location: [facility] ENDOSCOPY;  Service: Endoscopy;  Laterality: N/A;   GASTRIC BYPASS  03/2020    Current Outpatient Medications on File Prior to Visit  Medication Sig Dispense Refill   ARIPiprazole (ABILIFY) 5 MG tablet Take 5 mg by mouth daily.     citalopram (CELEXA) 20 MG tablet Take 20 mg by  mouth daily.     cyanocobalamin (,VITAMIN B-12,) 1000 MCG/ML injection INJECT 1 ML (1000 MCG) INTO THE MUSCLE MONTHLY     diphenhydrAMINE (BENADRYL) 25 mg capsule Take 1 capsule (25 mg total) by mouth every 8 (eight) hours as needed (headache).  0   FLUoxetine (PROZAC) 40 MG capsule Take 40 mg by mouth daily.     folic acid (FOLVITE) 1 MG tablet Take 1 mg by mouth daily.     levonorgestrel (MIRENA) 20 MCG/24HR IUD 1 each by Intrauterine route once.     methotrexate 250 MG/10ML injection Inject 25 mg into the skin once a week. Thursday     pantoprazole (PROTONIX) 40 MG tablet Take 40 mg by mouth daily.     prochlorperazine (COMPAZINE) 5 MG tablet Take 1 tablet (5 mg total) by mouth every 8 (eight) hours as needed (headache). 20 tablet 0   sertraline (ZOLOFT) 100 MG tablet Take 100 mg by mouth 2 (two) times daily.     topiramate (TOPAMAX) 100 MG tablet Pt reported not taking.     venlafaxine XR (EFFEXOR-XR) 150 MG 24 hr capsule Take 150 mg by mouth daily.     acetaZOLAMIDE (DIAMOX) 250 MG tablet Take 1 tablet (250 mg total) by mouth 2 (two) times daily. 60 tablet 0   potassium chloride (KLOR-CON 10) 10 MEQ  tablet Take 1 tablet (10 mEq total) by mouth daily for 5 days. 5 tablet 0   No current facility-administered medications on file prior to visit.    Allergies  Allergen Reactions   Eggs Or Egg-Derived Products Rash    Egg whites   Other Anaphylaxis    Nuts   Adhesive [Tape] Dermatitis    Blisters   Septra [Sulfamethoxazole-Trimethoprim] Rash   Sulfa Antibiotics Rash   Vancomycin Rash    Social History   Socioeconomic History   Marital status: Married    Spouse name: Not on file   Number of children: Not on file   Years of education: Not on file   Highest education level: Not on file  Occupational History   Not on file  Tobacco Use   Smoking status: Never   Smokeless tobacco: Never  Vaping Use   Vaping Use: Never used  Substance and Sexual Activity   Alcohol use: No    Drug use: No   Sexual activity: Yes    Birth control/protection: I.U.D.    Comment: mirena  Other Topics Concern   Not on file  Social History Narrative   Not on file   Social Determinants of Health   Financial Resource Strain: Not on file  Food Insecurity: Not on file  Transportation Needs: Not on file  Physical Activity: Not on file  Stress: Not on file  Social Connections: Not on file  Intimate Partner Violence: Not on file    Family History  Problem Relation Age of Onset   Cancer Paternal Grandmother        colon   Cancer Paternal Grandfather        colon   Breast cancer Neg Hx     The following portions of the patient's history were reviewed and updated as appropriate: allergies, current medications, past family history, past medical history, past social history, past surgical history and problem list.  Review of Systems Review of Systems - Negative except as mentioned in HPI Review of Systems - General ROS: negative for - chills, fatigue, fever, hot flashes, malaise or night sweats Hematological and Lymphatic ROS: negative for - bleeding problems or swollen lymph nodes Gastrointestinal ROS: negative for - abdominal pain, blood in stools, change in bowel habits and nausea/vomiting Musculoskeletal ROS: negative for - joint pain, muscle pain or muscular weakness Genito-Urinary ROS: negative for - change in menstrual cycle, dysmenorrhea, dyspareunia, dysuria, genital discharge, genital ulcers, hematuria, incontinence, irregular/heavy menses, nocturia or pelvic painjj  Objective:   BP (!) 169/102   Pulse (!) 101   Ht 5\' 2"  (1.575 m)   Wt 186 lb 6.4 oz (84.6 kg)   LMP  (LMP Unknown)   BMI 34.09 kg/m  CONSTITUTIONAL: Well-developed, well-nourished female in no acute distress.  HENT:  Normocephalic, atraumatic.  NECK: Normal range of motion, supple, no masses.  Normal thyroid.  SKIN: Skin is warm and dry. No rash noted. Not diaphoretic. No erythema. No  pallor. NEUROLGIC: Alert and oriented to person, place, and time. PSYCHIATRIC: Normal mood and affect. Normal behavior. Normal judgment and thought content. CARDIOVASCULAR:Not Examined RESPIRATORY: Not Examined BREASTS: Not Examined ABDOMEN: Soft, non distended; Non tender.  No Organomegaly. PELVIC:  External Genitalia: Normal  BUS: Normal  Vagina: Normal  Cervix: Normal, strings present  Uterus: Normal size, shape,consistency, mobile, tender on palpation  Adnexa: Normal, right and left tenderness  RV: Normal   Bladder: Nontender MUSCULOSKELETAL: Normal range of motion. No tenderness.  No cyanosis, clubbing, or edema.  Assessment:   Pelvic pain Urinary Frequency Inhibited Female orgasm      Plan:   Discussed premenopausal changes on vaginal tissue and bladder . Discussed kegel exercise and use of pelvic PT to inprove bladder function and orgasm issue. U/s ordered for evaluation for pelvic pain. Information sheet given to pt on perimenopausal change affecting bladder. Orders placed. Will follow up with results.   Doreene Burke, CNM

## 2021-09-07 NOTE — Patient Instructions (Signed)

## 2021-09-24 DIAGNOSIS — R102 Pelvic and perineal pain: Secondary | ICD-10-CM

## 2021-09-25 NOTE — Telephone Encounter (Signed)
Pt called asking if annie had a chance to review [place] results yet, Please Advise.

## 2021-09-25 NOTE — Telephone Encounter (Signed)
Pt states that she is having swelling in lower belly, back pain and pelvic/hip pain. Pt wanted provider aware. Pt states still urinating frequently.

## 2021-09-28 NOTE — Telephone Encounter (Signed)
Pt is calling in wanting to know to see if she can get a call back about her [place] where a cyst was found on her ovary.  Pt would like to have a call back.

## 2021-09-28 NOTE — Telephone Encounter (Signed)
Error/ltd ° °

## 2021-09-28 NOTE — Telephone Encounter (Signed)
Pt made aware and reminded that Mds must review U/S and pt will be notified with results as soon as reviewed.

## 2021-09-30 DIAGNOSIS — R399 Unspecified symptoms and signs involving the genitourinary system: Secondary | ICD-10-CM

## 2021-09-30 DIAGNOSIS — R3915 Urgency of urination: Secondary | ICD-10-CM

## 2021-09-30 DIAGNOSIS — F5231 Female orgasmic disorder: Secondary | ICD-10-CM

## 2021-09-30 DIAGNOSIS — R102 Pelvic and perineal pain: Secondary | ICD-10-CM

## 2021-09-30 NOTE — Telephone Encounter (Signed)
LVM for pt to call back.

## 2021-10-07 DIAGNOSIS — R102 Pelvic and perineal pain: Secondary | ICD-10-CM

## 2021-10-07 DIAGNOSIS — F5231 Female orgasmic disorder: Secondary | ICD-10-CM

## 2021-10-07 DIAGNOSIS — R350 Frequency of micturition: Secondary | ICD-10-CM

## 2021-10-07 NOTE — Telephone Encounter (Signed)
Pt is calling in stating that she would like to if she can get the information that Pattricia Boss was going to give her about the pelvic therapist she stated that she has not heard anything from anyone.  Pt would like to have a call back.

## 2021-10-12 DIAGNOSIS — R1032 Left lower quadrant pain: Secondary | ICD-10-CM

## 2021-10-12 DIAGNOSIS — R350 Frequency of micturition: Secondary | ICD-10-CM | POA: Diagnosis not present

## 2021-10-12 NOTE — Patient Instructions (Signed)

## 2021-10-12 NOTE — Progress Notes (Signed)
10/12/2021 8:32 AM   Elder Negus [birth year] [phone]  Referring provider: Doreene Burke, CNM [address] [place],  [place] [address]  Chief Complaint  Patient presents with   Urinary Frequency    HPI: I was consulted to assess the patient's frequency and pelvic discomfort.  Daily she is suprapubic discomfort that radiates to her right lower quadrant and right back.  She also has discomfort in the vagina.  Its been present for approximately 2 months.  She is doing much more frequently than she used to  She currently voids every hour or more frequently.  She gets up 8 or 9 times at night.  She cannot hold it for 2 hours.  She has rare stress incontinence with a sneeze but does not wear a pad  Her flow was good and sometimes she does not feel empty  She has had low back and neck surgery.  She has not had a hysterectomy  No recent urine cultures.  In April 2022 she had a CT of the abdomen and pelvis with contrast.  Kidneys were normal.  She has had previous gastric bypass.  She had sigmoid diverticulosis but no diverticulitis.  She was in the hospital the top of sarcoidosis  No history of kidney stones bladder surgery or bladder infections.   PMH: Past Medical History:  Diagnosis Date   Arthritis    Fatty liver    GERD (gastroesophageal reflux disease)    Hyperlipidemia    Hypertension    Sarcoidosis     Surgical History: Past Surgical History:  Procedure Laterality Date   ANTERIOR CRUCIATE LIGAMENT REPAIR Bilateral    back surgery     CHOLECYSTECTOMY     COLONOSCOPY WITH PROPOFOL N/A 02/23/2016   Procedure: COLONOSCOPY WITH PROPOFOL;  Surgeon: Christena Deem, MD;  Location: [facility] ENDOSCOPY;  Service: Endoscopy;  Laterality: N/A;   ESOPHAGOGASTRODUODENOSCOPY (EGD) WITH PROPOFOL N/A 02/23/2016   Procedure: ESOPHAGOGASTRODUODENOSCOPY (EGD) WITH PROPOFOL;  Surgeon: Christena Deem, MD;  Location: [facility] ENDOSCOPY;  Service: Endoscopy;  Laterality:  N/A;   GASTRIC BYPASS  03/2020    Home Medications:  Allergies as of 10/12/2021       Reactions   Eggs Or Egg-derived Products Rash   Egg whites   Other Anaphylaxis   Nuts   Adhesive [tape] Dermatitis   Blisters   Septra [sulfamethoxazole-trimethoprim] Rash   Sulfa Antibiotics Rash   Vancomycin Rash        Medication List        Accurate as of October 12, 2021  8:32 AM. If you have any questions, ask your nurse or doctor.          STOP taking these medications    acetaZOLAMIDE 250 MG tablet Commonly known as: DIAMOX Stopped by: Martina Sinner, MD       TAKE these medications    ARIPiprazole 5 MG tablet Commonly known as: ABILIFY Take 5 mg by mouth daily.   citalopram 20 MG tablet Commonly known as: CELEXA Take 20 mg by mouth daily.   cyanocobalamin 1000 MCG/ML injection Commonly known as: (VITAMIN B-12) INJECT 1 ML (1000 MCG) INTO THE MUSCLE MONTHLY   diphenhydrAMINE 25 mg capsule Commonly known as: BENADRYL Take 1 capsule (25 mg total) by mouth every 8 (eight) hours as needed (headache).   FLUoxetine 40 MG capsule Commonly known as: PROZAC Take 40 mg by mouth daily.   folic acid 1 MG tablet Commonly known as: FOLVITE Take 1 mg  by mouth daily.   levonorgestrel 20 MCG/24HR IUD Commonly known as: MIRENA 1 each by Intrauterine route once.   methotrexate 250 MG/10ML injection Inject 25 mg into the skin once a week. Thursday   pantoprazole 40 MG tablet Commonly known as: PROTONIX Take 40 mg by mouth daily.   potassium chloride 10 MEQ tablet Commonly known as: [place]-Con 10 Take 1 tablet (10 mEq total) by mouth daily for 5 days.   prochlorperazine 5 MG tablet Commonly known as: COMPAZINE Take 1 tablet (5 mg total) by mouth every 8 (eight) hours as needed (headache).   sertraline 100 MG tablet Commonly known as: ZOLOFT Take 100 mg by mouth 2 (two) times daily.   topiramate 100 MG tablet Commonly known as: TOPAMAX Pt reported not  taking.   venlafaxine XR 150 MG 24 hr capsule Commonly known as: EFFEXOR-XR Take 150 mg by mouth daily.        Allergies:  Allergies  Allergen Reactions   Eggs Or Egg-Derived Products Rash    Egg whites   Other Anaphylaxis    Nuts   Adhesive [Tape] Dermatitis    Blisters   Septra [Sulfamethoxazole-Trimethoprim] Rash   Sulfa Antibiotics Rash   Vancomycin Rash    Family History: Family History  Problem Relation Age of Onset   Cancer Paternal Grandmother        colon   Cancer Paternal Grandfather        colon   Breast cancer Neg Hx     Social History:  reports that she has never smoked. She has never used smokeless tobacco. She reports that she does not drink alcohol and does not use drugs.  ROS:                                        Physical Exam: BP (!) 165/106   Pulse 64   Ht 5\' 2"  (1.575 m)   Wt 84.4 kg   BMI 34.02 kg/m   Constitutional:  Alert and oriented, No acute distress. HEENT: [place] AT, moist mucus membranes.  Trachea midline, no masses. Cardiovascular: No clubbing, cyanosis, or edema. Respiratory: Normal respiratory effort, no increased work of breathing. GI: Abdomen is soft, nontender, nondistended, no abdominal masses GU: No prolapse.  No stress incontinence.  Right levator muscle a bit tender and tight.  Bladder and left levator normal Skin: No rashes, bruises or suspicious lesions. Lymph: No cervical or inguinal adenopathy. Neurologic: Grossly intact, no focal deficits, moving all 4 extremities. Psychiatric: Normal mood and affect.  Laboratory Data: Lab Results  Component Value Date   WBC 10.5 03/10/2021   HGB 12.9 03/10/2021   HCT 40.4 03/10/2021   MCV 79.5 (L) 03/10/2021   PLT 498 (H) 03/10/2021    Lab Results  Component Value Date   CREATININE 0.77 03/10/2021    No results found for: PSA  No results found for: TESTOSTERONE  Lab Results  Component Value Date   HGBA1C 5.6 11/01/2020    Urinalysis     Component Value Date/Time   COLORURINE YELLOW (A) 03/10/2021 1536   APPEARANCEUR HAZY (A) 03/10/2021 1536   LABSPEC 1.015 03/10/2021 1536   PHURINE 5.0 03/10/2021 1536   GLUCOSEU NEGATIVE 03/10/2021 1536   HGBUR NEGATIVE 03/10/2021 1536   BILIRUBINUR NEGATIVE 03/10/2021 1536   BILIRUBINUR neg 11/20/2015 1514   KETONESUR NEGATIVE 03/10/2021 1536   PROTEINUR NEGATIVE 03/10/2021 1536   UROBILINOGEN  0.2 11/20/2015 1514   NITRITE NEGATIVE 03/10/2021 1536   LEUKOCYTESUR NEGATIVE 03/10/2021 1536    Pertinent Imaging: Urine reviewed.  Urine sent for culture.  Chart reviewed  Assessment & Plan: Based upon the timing of her symptoms I thought it was best to repeat the CT scan of the abdomen pelvis with and without contrast.  I will have the patient come back for cystoscopy.  She may need a hydrodistention in the future.  We will proceed accordingly call if urine culture is positive.  The increased frequency does suggest a possible bladder etiology  1. Urinary frequency  - Urinalysis, Complete   No follow-ups on file.  Martina Sinner, MD  [facility] [address], [address] [place], [place] [address] [phone]

## 2021-10-20 DIAGNOSIS — R350 Frequency of micturition: Secondary | ICD-10-CM | POA: Insufficient documentation

## 2021-10-20 DIAGNOSIS — R1032 Left lower quadrant pain: Secondary | ICD-10-CM | POA: Insufficient documentation

## 2021-11-24 DIAGNOSIS — M6281 Muscle weakness (generalized): Secondary | ICD-10-CM | POA: Insufficient documentation

## 2021-11-24 DIAGNOSIS — R278 Other lack of coordination: Secondary | ICD-10-CM | POA: Insufficient documentation

## 2021-11-24 DIAGNOSIS — R350 Frequency of micturition: Secondary | ICD-10-CM | POA: Diagnosis not present

## 2021-11-24 DIAGNOSIS — F5231 Female orgasmic disorder: Secondary | ICD-10-CM | POA: Diagnosis not present

## 2021-11-24 DIAGNOSIS — R102 Pelvic and perineal pain: Secondary | ICD-10-CM | POA: Insufficient documentation

## 2021-11-24 NOTE — Therapy (Signed)
[facility] [address] [place], [place], [address] Phone: [phone]   Fax:  [phone]  Physical Therapy Evaluation  Patient Details  Name: Courtney Simpson MRN: [id number] Date of Birth: [birth year] Referring Provider (PT): Philip Aspen   Encounter Date: 11/24/2021   PT End of Session - 11/24/21 1128     Visit Number 1    Number of Visits 12    Date for PT Re-Evaluation 02/16/22    Authorization Type IE 11/24/2021    PT Start Time 1130    PT Stop Time 1210    PT Time Calculation (min) 40 min    Activity Tolerance Patient tolerated treatment well    Behavior During Therapy [facility] for tasks assessed/performed             Past Medical History:  Diagnosis Date   Arthritis    Fatty liver    GERD (gastroesophageal reflux disease)    Hyperlipidemia    Hypertension    Sarcoidosis     Past Surgical History:  Procedure Laterality Date   ANTERIOR CRUCIATE LIGAMENT REPAIR Bilateral    back surgery     CHOLECYSTECTOMY     COLONOSCOPY WITH PROPOFOL N/A 02/23/2016   Procedure: COLONOSCOPY WITH PROPOFOL;  Surgeon: Lollie Sails, MD;  Location: [facility] ENDOSCOPY;  Service: Endoscopy;  Laterality: N/A;   ESOPHAGOGASTRODUODENOSCOPY (EGD) WITH PROPOFOL N/A 02/23/2016   Procedure: ESOPHAGOGASTRODUODENOSCOPY (EGD) WITH PROPOFOL;  Surgeon: Lollie Sails, MD;  Location: [facility] ENDOSCOPY;  Service: Endoscopy;  Laterality: N/A;   GASTRIC BYPASS  03/2020    There were no vitals filed for this visit.        [facility] PT Assessment - 11/24/21 0001       Assessment   Referring Provider (PT) Philip Aspen    Hand Dominance Right      Balance Screen   Has the patient fallen in the past 6 months No             PELVIC HEALTH PHYSICAL THERAPY EVALUATION  SCREENING Red Flags: None Have you had any night sweats? Unexplained weight loss? Saddle anesthesia? Unexplained changes in bowel or bladder  habits?   SUBJECTIVE  Chief Complaint: Patient notes that she is peeing all the time and finds this aggravating. Patient notes that she is also having intimacy issues and is unable to achieve climax. Patient notes that since being on various medications, she has not been able to achieve consistent orgasms. This has been happening over the past three years. Patient notes that she has woken up as many as 11 times in one night. Patient notes that this morning in her first hour of waking, she emptied her bladder every 15 minutes. Patient notes that she does not have consistent amount voided ranging from barely anything to "is it ever going to stop." Patient notes with respect to sexual function; she has not had any significant stressors besides somewhat recent bout of sarcoidosis of eyes when vision was lost.   Pertinent History:  Falls Negative.  Scoliosis Negative. Pulmonary disease/dysfunction Positive for OSA. Surgical history: Positive for see above.   Recent Procedures/Tests/Findings: CT of abdomen WNL (10/20/2021; order by urology)  Obstetrical History: G1P1 Deliveries: SVD Tearing/Episiotomy: grade 2 (?)  Gynecological History: Hysterectomy: No Endometriosis: Negative Pelvic Organ Prolapse: Negative Last Menstrual Period:  Pain with exam: Yes (last few times) Heaviness/pressure: Yes (suprapubically, rectally, vaginally)  Urinary History: Incontinence: Positive. Onset: 10/2021 Triggers: sneezing; laughing; coughing (denies any leakage with  fast movement, STS)  Amount: Min/Mod. Protective undergarments: No   Fluid Intake: 32 oz H20, 1 cup caffeinated, 2x 16 oz caffeine free sodas Nocturia: 5-11x/night Frequency of urination: every <1 hours  Toileting posture: feet flat Pain with urination: Negative Difficulty initiating urination: Positive for < 50%. Intermittent stream: Positive Frequent UTI: Negative.   Gastrointestinal History: [place] Stool Chart: Type 7 (occasional type  6) Frequency of BMs: ~6x/day Pain with defecation: Negative Straining with defecation: Negative Hemorrhoids: Positive; external; latent Toileting posture: elevated heels Incontinence: Positive. Onset: 2021 Triggers: passing gas, urge Amount: Min/Mod. Typical food intake: Breakfast: bagel with cream cheese Lunch:variable, not always eaten Dinner:chicken/pork chop/tacos and green beans/cabbage Snacks: occasional osyster cracks, celery, carrot sticks  Sexual activity/pain: Pain with intercourse: Positive for > 50%; more of a recent occurrence (over the past 3 years).   Initial penetration: Yes  Deep thrusting: Yes External stimulation: No; notes discomfort/absent pleasure whether with partner or self.  Change in ability to achieve orgasm: Yes  Location of pain: vaginally and lower abdomen Current pain:  0/10  Max pain:  8/10 Least pain:  0/10 Pain quality: pain quality: stabbing Radiating pain: No (has felt it in the low back)   OBJECTIVE  Mental Status Patient is oriented to person, place and time.  Recent memory is intact.  Remote memory is intact.  Attention span and concentration are intact.  Expressive speech is intact.  Patient's fund of knowledge is within normal limits for educational level.  POSTURE/OBSERVATIONS:  Lumbar lordosis: appearing diminished Thoracic kyphosis: appearing increased Patient's seated posture is suggestive of shortened anterior chain and excessive use of accessory mm of respiration.   GAIT: Grossly WFL.   RANGE OF MOTION: deferred 2/2 to time constraints   LEFT RIGHT  Lumbar forward flexion (65):      Lumbar extension (30):     Lumbar lateral flexion (25):     Thoracic and Lumbar rotation (30 degrees):       Hip Flexion (0-125):      Hip IR (0-45):     Hip ER (0-45):     Hip Abduction (0-40):     Hip extension (0-15):       STRENGTH: MMT deferred 2/2 to time constraints  RLE LLE  Hip Flexion    Hip Extension    Hip Abduction      Hip Adduction     Hip ER     Hip IR     Knee Extension    Knee Flexion    Dorsiflexion     Plantarflexion (seated)     ABDOMINAL: deferred 2/2 to time constraints Palpation: Diastasis: Scar mobility: Rib flare:  SPECIAL TESTS: deferred 2/2 to time constraints   PHYSICAL PERFORMANCE MEASURES: STS: WFL, performed without BUE support  EXTERNAL PELVIC EXAM: deferred 2/2 to time constraints Palpation: Breath coordination: Voluntary Contraction: present/absent Relaxation: full/delayed/non-relaxing Perineal movement with sustained IAP increase ("bear down"): descent/no change/elevation/excessive descent Perineal movement with rapid IAP increase ("cough"): elevation/no change/descent  INTERNAL VAGINAL EXAM: deferred 2/2 to time constraints Introitus Appears:  Skin integrity:  Scar mobility: Strength (PERF):  Symmetry: Palpation: Prolapse:   INTERNAL RECTAL EXAM: not indicated Strength (PERF): Symmetry: Palpation: Prolapse:   OUTCOME MEASURES: FOTO (Urinary Problem 56)   ASSESSMENT Patient is a 46 year old presenting to clinic with chief complaints of pelvic pain, urinary frequency, and change in ability to achieve orgasm. Today's evaluation is suggestive of deficits in PFM coordination, PFM strength, posture, and pain as evidenced by 8/10 worst  pain vaginally and suprapubically, increased thoracic kyphosis with subsequent shortening of anterior chain, pain with penetration, urination frequency up to every 15 minutes, and urinary incontinence with quick increase in IAP (cough/sneeze/laugh). Patient's responses on FOTO Urinary Problem outcome measures (56) indicate moderate, but significant functional limitations/disability/distress. Patient's progress may be limited due to persistence of complaints for greater than 6 months (roughly 3 years since onset); however, patient's motivation is advantageous. It is worth considering patient's symptoms in the context of age related  hormonal changes that may be occurring. Patient will benefit from continued skilled therapeutic intervention to address deficits in PFM coordination, PFM strength, posture, and pain in order to increase function and improve overall QOL.  EDUCATION Patient educated on prognosis, POC, and provided with HEP including: bladder diary. Patient articulated understanding and returned demonstration. Patient will benefit from further education in order to maximize compliance and understanding for long-term therapeutic gains.  TREATMENT Neuromuscular Re-education: Patient educated on primary functions of the pelvic floor including: posture/balance, sexual pleasure, storage and elimination of waste from the body, abdominal cavity closure, and breath coordination.     Objective measurements completed on examination: See above findings.         PT Long Term Goals - 11/24/21 1519       PT LONG TERM GOAL #1   Title Patient will demonstrate independence with HEP in order to maximize therapeutic gains and improve carryover from physical therapy sessions to ADLs in the home and community.    Baseline IE: not initiated    Time 12    Period Weeks    Status New    Target Date 02/16/22      PT LONG TERM GOAL #2   Title Patient will decrease worst pain as reported on NPRS by at least 2 points to demonstrate clinically significant reduction in pain in order to restore/improve function and overall QOL.    Baseline IE: vaginally, suprapubically 8/10 (stabbing)    Time 12    Period Weeks    Status New    Target Date 02/16/22      PT LONG TERM GOAL #3   Title Patient will demonstrate circumferential and sequential contraction of >4/5 MMT, > 6 sec hold x10 and 5 consecutive quick flicks with </= 10 min rest between testing bouts, and relaxation of the PFM coordinated with breath for improved management of intra-abdominal pressure and normal bowel and bladder function without the presence of pain nor  incontinence in order to improve participation at home and in the community.    Baseline IE: not demonstrated    Time 12    Period Weeks    Status New    Target Date 02/16/22      PT LONG TERM GOAL #4   Title Patient will demonstrate coordinated lengthening and relaxation of PFM with diaphragmatic inhalation in order to decrease spasm and allow for unrestricted elimination of urine/feces for improved overall QOL.    Baseline IE: not demonstrated    Time 12    Period Weeks    Status New    Target Date 02/16/22      PT LONG TERM GOAL #5   Title Patient will demonstrate improved function as evidenced by a score of 68 on FOTO measure for full participation in activities at home and in the community.    Baseline IE: 56    Time 12    Period Weeks    Status New    Target Date 02/16/22  Plan - 11/24/21 1456     Clinical Impression Statement Patient is a 46 year old presenting to clinic with chief complaints of pelvic pain, urinary frequency, and change in ability to achieve orgasm. Today's evaluation is suggestive of deficits in PFM coordination, PFM strength, posture, and pain as evidenced by 8/10 worst pain vaginally and suprapubically, increased thoracic kyphosis with subsequent shortening of anterior chain, pain with penetration, urination frequency up to every 15 minutes, and urinary incontinence with quick increase in IAP (cough/sneeze/laugh). Patient's responses on FOTO Urinary Problem outcome measures (56) indicate moderate, but significant functional limitations/disability/distress. Patient's progress may be limited due to persistence of complaints for greater than 6 months (roughly 3 years since onset); however, patient's motivation is advantageous. It is worth considering patient's symptoms in the context of age related hormonal changes that may be occurring. Patient will benefit from continued skilled therapeutic intervention to address deficits in PFM  coordination, PFM strength, posture, and pain in order to increase function and improve overall QOL.    Personal Factors and Comorbidities Age;Behavior Pattern;Comorbidity 3+;Fitness;Past/Current Experience;Time since onset of injury/illness/exacerbation    Comorbidities OSA, depression, sarcoidosis, arthritis, HLD, HTN GERD    Examination-Activity Limitations Sleep;Continence;Other    Examination-Participation Restrictions Community Activity;Interpersonal Relationship    Stability/Clinical Decision Making Evolving/Moderate complexity    Clinical Decision Making Moderate    Rehab Potential Fair    PT Frequency 1x / week    PT Duration 12 weeks    PT Treatment/Interventions ADLs/Self Care Home Management;Cryotherapy;Electrical Stimulation;Moist Heat;Therapeutic activities;Therapeutic exercise;Neuromuscular re-education;Patient/family education;Orthotic Fit/Training;Manual techniques;Scar mobilization;Taping;Spinal Manipulations;Joint Manipulations    PT Next Visit Plan physical assessment    PT Home Exercise Plan bladder diary via iUflow app    Consulted and Agree with Plan of Care Patient             Patient will benefit from skilled therapeutic intervention in order to improve the following deficits and impairments:  Decreased coordination, Decreased endurance, Decreased activity tolerance, Pain, Improper body mechanics, Postural dysfunction, Decreased strength  Visit Diagnosis: Pelvic pain - Plan: PT plan of care cert/re-cert  Other lack of coordination - Plan: PT plan of care cert/re-cert  Muscle weakness (generalized) - Plan: PT plan of care cert/re-cert     Problem List Patient Active Problem List   Diagnosis Date Noted   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 09/07/2021   Urinary frequency 09/07/2021   Pelvic pain [id number]   Complicated headache syndromes 11/01/2020   Stroke ([place]) 10/31/2020   Sarcoidosis of lung ([place]) 10/31/2020   Sarcoidosis 10/31/2020   Uveitis 10/31/2020    Obesity (BMI 30-39.9) 10/31/2020   Depression 10/31/2020   Sleep apnea in adult 10/31/2020   Hot flashes 09/22/2020   Vitamin D deficiency 11/27/2015   Gallstones 02/05/2014   Morbid obesity ([place]) 02/05/2014    Myles Gip PT, DPT [phone]  11/24/2021, 3:25 PM  [facility] [address] [place], [place], [address] Phone: [phone]   Fax:  [phone]  Name: Courtney Simpson MRN: [id number] Date of Birth: [birth year]

## 2021-12-17 DIAGNOSIS — R278 Other lack of coordination: Secondary | ICD-10-CM | POA: Insufficient documentation

## 2021-12-17 DIAGNOSIS — R102 Pelvic and perineal pain: Secondary | ICD-10-CM | POA: Diagnosis present

## 2021-12-17 DIAGNOSIS — M6281 Muscle weakness (generalized): Secondary | ICD-10-CM | POA: Diagnosis present

## 2021-12-17 NOTE — Therapy (Signed)
PT Next Visit Plan posture/abdominals    PT Home Exercise Plan PFM lengthening postures with breathing    Consulted and Agree with Plan of Care Patient             Patient will benefit from skilled therapeutic intervention in order to improve the following deficits and impairments:  Decreased coordination, Decreased endurance, Decreased activity tolerance, Pain, Improper body mechanics, Postural dysfunction, Decreased strength  Visit Diagnosis: Pelvic pain  Other lack of coordination  Muscle weakness (generalized)     Problem List Patient Active Problem List   Diagnosis Date Noted   Inhibited orgasm  female 09/07/2021   Urinary frequency 09/07/2021   Pelvic pain 09/07/2021   Complicated headache syndromes 11/01/2020   Stroke ([facility]) 10/31/2020   Sarcoidosis of lung ([facility]) 10/31/2020   Sarcoidosis 10/31/2020   Uveitis 10/31/2020   Obesity (BMI 30-39.9) 10/31/2020   Depression 10/31/2020   Sleep apnea in adult 10/31/2020   Hot flashes 09/22/2020   Vitamin D deficiency 11/27/2015   Gallstones 02/05/2014   Morbid obesity ([facility]) 02/05/2014    Sheria LangKatlin Allyiah Gartner PT, DPT [phone]  12/17/2021, 5:56 PM  [facility] [address] [place], [place], [address] Phone: [phone]   Fax:  [phone]  Name: Courtney Simpson MRN: [id number] Date of Birth: [birth year]  [facility] [address] [place], [place], [address] Phone: [phone]   Fax:  [phone]  Physical Therapy Treatment  Patient Details  Name: Courtney Simpson MRN: [id number] Date of Birth: 1976-04-14 Referring Provider (PT): Doreene Burke   Encounter Date: 12/17/2021   PT End of Session - 12/17/21 0935     Visit Number 2    Number of Visits 12    Date for PT Re-Evaluation 02/16/22    Authorization Type IE 11/24/2021    PT Start Time 0935    PT Stop Time 1020    PT Time Calculation (min) 45 min    Activity Tolerance Patient tolerated treatment well    Behavior During Therapy [facility] for tasks assessed/performed             Past Medical History:  Diagnosis Date   Arthritis    Fatty liver    GERD (gastroesophageal reflux disease)    Hyperlipidemia    Hypertension    Sarcoidosis     Past Surgical History:  Procedure Laterality Date   ANTERIOR CRUCIATE LIGAMENT REPAIR Bilateral    back surgery     CHOLECYSTECTOMY     COLONOSCOPY WITH PROPOFOL N/A 02/23/2016   Procedure: COLONOSCOPY WITH PROPOFOL;  Surgeon: Christena Deem, MD;  Location: [facility] ENDOSCOPY;  Service: Endoscopy;  Laterality: N/A;   ESOPHAGOGASTRODUODENOSCOPY (EGD) WITH PROPOFOL N/A 02/23/2016   Procedure: ESOPHAGOGASTRODUODENOSCOPY (EGD) WITH PROPOFOL;  Surgeon: Christena Deem, MD;  Location: [facility] ENDOSCOPY;  Service: Endoscopy;  Laterality: N/A;   GASTRIC BYPASS  03/2020    There were no vitals filed for this visit.   Subjective Assessment - 12/17/21 0942     Subjective Patient notes that frequent urination has improved. She is not having any urinary hesitancy. Patient has increased water intake but cannot pinpoint any other significant changes. Patient has also been able to achieve climax with sexual activity. Patient has noticed increased muscle spasm afterward.    Currently in Pain? No/denies             TREATMENT  Pre-treatment  assessment: EXTERNAL PELVIC EXAM: Patient educated on the purpose of the pelvic exam and articulated understanding; patient consented to the exam verbally. Breath coordination: present with cueing for prolonged inhalation Cued Lengthen: no perineal descent Cued Contraction: perineal ascent Cued relaxation: delayed Cough: no perineal movement  Neuromuscular Re-education: Supine hooklying diaphragmatic breathing with VCs and TCs for downregulation of the nervous system and improved management of IAP Supine hooklying, PFM lengthening with inhalation. VCs and TCs to decrease compensatory patterns and encourage optimal relaxation of the PFM. Supine knee to chest with PFM lengthening, BLE, for improved PFM spasm release Supine double knee to chest with PFM lengthening for improved PFM spasm release Supine butterfly with PFM lengthening, BLE, for improved PFM tissue length Patient education on female ejaculation and provided with seminal article/chapter by Dr. Meriam Sprague Whipple.    Patient educated throughout session on appropriate technique and form using multi-modal cueing, HEP, and activity modification. Patient articulated understanding and returned demonstration.  Patient Response to interventions: Comfortable to try stretches this weekend.  ASSESSMENT Patient presents to clinic with excellent motivation to participate in therapy. Patient demonstrates deficits in PFM coordination, PFM strength, posture, and pain. Patient able to achieve good proprioceptive awareness with PFM lengthening interventions during today's session and responded positively to educational and active interventions. Patient will benefit from continued skilled therapeutic intervention to address remaining deficits in PFM coordination, PFM strength, posture,  PT Next Visit Plan posture/abdominals    PT Home Exercise Plan PFM lengthening postures with breathing    Consulted and Agree with Plan of Care Patient             Patient will benefit from skilled therapeutic intervention in order to improve the following deficits and impairments:  Decreased coordination, Decreased endurance, Decreased activity tolerance, Pain, Improper body mechanics, Postural dysfunction, Decreased strength  Visit Diagnosis: Pelvic pain  Other lack of coordination  Muscle weakness (generalized)     Problem List Patient Active Problem List   Diagnosis Date Noted   Inhibited orgasm  female 09/07/2021   Urinary frequency 09/07/2021   Pelvic pain 09/07/2021   Complicated headache syndromes 11/01/2020   Stroke ([facility]) 10/31/2020   Sarcoidosis of lung (HCC) 10/31/2020   Sarcoidosis 10/31/2020   Uveitis 10/31/2020   Obesity (BMI 30-39.9) 10/31/2020   Depression 10/31/2020   Sleep apnea in adult 10/31/2020   Hot flashes 09/22/2020   Vitamin D deficiency 11/27/2015   Gallstones 02/05/2014   Morbid obesity (H[facility]) 02/05/2014    Sheria LangKatlin Allyiah Gartner PT, DPT [phone]  12/17/2021, 5:56 PM  [facility] [address] [place], [place], [address] Phone: [phone]   Fax:  [phone]  Name: Courtney Simpson MRN: [id number] Date of Birth: [birth year]

## 2021-12-21 DIAGNOSIS — R102 Pelvic and perineal pain: Secondary | ICD-10-CM | POA: Diagnosis not present

## 2021-12-21 DIAGNOSIS — M6281 Muscle weakness (generalized): Secondary | ICD-10-CM

## 2021-12-21 DIAGNOSIS — R278 Other lack of coordination: Secondary | ICD-10-CM

## 2021-12-21 NOTE — Therapy (Signed)
[facility] [address] [place], [place], [address] Phone: [phone]   Fax:  [phone]  Physical Therapy Treatment  Patient Details  Name: Courtney Simpson MRN: [id number] Date of Birth: 02-Feb-1976 Referring Provider (PT): Doreene Burke   Encounter Date: 12/21/2021   PT End of Session - 12/21/21 1034     Visit Number 3    Number of Visits 12    Date for PT Re-Evaluation 02/16/22    Authorization Type IE 11/24/2021    PT Start Time 1030    PT Stop Time 1110    PT Time Calculation (min) 40 min    Activity Tolerance Patient tolerated treatment well    Behavior During Therapy [facility] for tasks assessed/performed             Past Medical History:  Diagnosis Date   Arthritis    Fatty liver    GERD (gastroesophageal reflux disease)    Hyperlipidemia    Hypertension    Sarcoidosis     Past Surgical History:  Procedure Laterality Date   ANTERIOR CRUCIATE LIGAMENT REPAIR Bilateral    back surgery     CHOLECYSTECTOMY     COLONOSCOPY WITH PROPOFOL N/A 02/23/2016   Procedure: COLONOSCOPY WITH PROPOFOL;  Surgeon: Christena Deem, MD;  Location: [facility] ENDOSCOPY;  Service: Endoscopy;  Laterality: N/A;   ESOPHAGOGASTRODUODENOSCOPY (EGD) WITH PROPOFOL N/A 02/23/2016   Procedure: ESOPHAGOGASTRODUODENOSCOPY (EGD) WITH PROPOFOL;  Surgeon: Christena Deem, MD;  Location: [facility] ENDOSCOPY;  Service: Endoscopy;  Laterality: N/A;   GASTRIC BYPASS  03/2020    There were no vitals filed for this visit.   Subjective Assessment - 12/21/21 1033     Subjective Patient denies any changes since last visit. Has been busy at home with [employer]. Patient notes that she continues to perform stretches with good effect.    Currently in Pain? No/denies              TREATMENT  Neuromuscular Re-education: Supine hooklying diaphragmatic breathing with VCs and TCs for downregulation of the nervous system and improved management of  IAP Sidelying, thoracolumbar rotations (open-book) bilaterally with diaphragmatic breathing for improved diaphragmatic and rib cage excursion. VCs and TCs to prevent compensations. Supine thoracic extension sustained hold for improved posture and IAP management Supine hooklying, TrA activation with exhalation. VCs and TCs to decrease compensatory patterns and minimize aggravation of the lumbar paraspinals. Supine Modified dead bug, UE only, LE Feb 02, 2023, combined with coordinated breath for improved postural control Counter bird dog progression with coordinated breath for improved postural control Reassessed FOTO.   Patient educated throughout session on appropriate technique and form using multi-modal cueing, HEP, and activity modification. Patient articulated understanding and returned demonstration.  Patient Response to interventions: Would like to use exercises independently and follow up as needed  ASSESSMENT Patient presents to clinic with excellent motivation to participate in therapy. Patient demonstrates deficits in PFM coordination, PFM strength, posture, and pain. Patient with adequate form performing all postural control interventions during today's session and responded positively to active interventions. Patient interested in trial period of home management with follow-up scheduled as needed. Patient states she feels comfortable with her current progress and home exercises. Patient will benefit from continued skilled therapeutic intervention to address remaining deficits in PFM coordination, PFM strength, posture, and pain in order to  increase function, and improve overall QOL.    PT Long Term Goals - 11/24/21 1519       PT LONG TERM  GOAL #1   Title Patient will demonstrate independence with HEP in order to maximize therapeutic gains and improve carryover from physical therapy sessions to ADLs in the home and community.    Baseline IE: not initiated    Time 12    Period Weeks     Status New    Target Date 02/16/22      PT LONG TERM GOAL #2   Title Patient will decrease worst pain as reported on NPRS by at least 2 points to demonstrate clinically significant reduction in pain in order to restore/improve function and overall QOL.    Baseline IE: vaginally, suprapubically 8/10 (stabbing)    Time 12    Period Weeks    Status New    Target Date 02/16/22      PT LONG TERM GOAL #3   Title Patient will demonstrate circumferential and sequential contraction of >4/5 MMT, > 6 sec hold x10 and 5 consecutive quick flicks with </= 10 min rest between testing bouts, and relaxation of the PFM coordinated with breath for improved management of intra-abdominal pressure and normal bowel and bladder function without the presence of pain nor incontinence in order to improve participation at home and in the community.    Baseline IE: not demonstrated    Time 12    Period Weeks    Status New    Target Date 02/16/22      PT LONG TERM GOAL #4   Title Patient will demonstrate coordinated lengthening and relaxation of PFM with diaphragmatic inhalation in order to decrease spasm and allow for unrestricted elimination of urine/feces for improved overall QOL.    Baseline IE: not demonstrated    Time 12    Period Weeks    Status New    Target Date 02/16/22      PT LONG TERM GOAL #5   Title Patient will demonstrate improved function as evidenced by a score of 68 on FOTO measure for full participation in activities at home and in the community.    Baseline IE: 56    Time 12    Period Weeks    Status New    Target Date 02/16/22                   Plan - 12/21/21 1034     Clinical Impression Statement Patient presents to clinic with excellent motivation to participate in therapy. Patient demonstrates deficits in PFM coordination, PFM strength, posture, and pain. Patient with adequate form performing all postural control interventions during today's session and responded  positively to active interventions. Patient interested in trial period of home management with follow-up scheduled as needed. Patient states she feels comfortable with her current progress and home exercises. Patient will benefit from continued skilled therapeutic intervention to address remaining deficits in PFM coordination, PFM strength, posture, and pain in order to  increase function, and improve overall QOL.    Personal Factors and Comorbidities Age;Behavior Pattern;Comorbidity 3+;Fitness;Past/Current Experience;Time since onset of injury/illness/exacerbation    Comorbidities OSA, depression, sarcoidosis, arthritis, HLD, HTN GERD    Examination-Activity Limitations Sleep;Continence;Other    Examination-Participation Restrictions Community Activity;Interpersonal Relationship    Stability/Clinical Decision Making Evolving/Moderate complexity    Rehab Potential Fair    PT Frequency 1x / week    PT Duration 12 weeks    PT Treatment/Interventions ADLs/Self Care Home Management;Cryotherapy;Electrical Stimulation;Moist Heat;Therapeutic activities;Therapeutic exercise;Neuromuscular re-education;Patient/family education;Orthotic Fit/Training;Manual techniques;Scar mobilization;Taping;Spinal Manipulations;Joint Manipulations    PT Next Visit Plan posture/abdominals    PT  Home Exercise Plan PFM lengthening postures with breathing    Consulted and Agree with Plan of Care Patient             Patient will benefit from skilled therapeutic intervention in order to improve the following deficits and impairments:  Decreased coordination, Decreased endurance, Decreased activity tolerance, Pain, Improper body mechanics, Postural dysfunction, Decreased strength  Visit Diagnosis: Pelvic pain  Other lack of coordination  Muscle weakness (generalized)     Problem List Patient Active Problem List   Diagnosis Date Noted   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 [place], [place], [address] Phone: [phone]   Fax:  [phone]  Name: Courtney Simpson MRN: [id number] Date of Birth: [birth year]

## 2022-03-01 DIAGNOSIS — L03113 Cellulitis of right upper limb: Secondary | ICD-10-CM

## 2022-03-01 MED ORDER — CETIRIZINE HCL 10 MG PO TABS
10.0000 mg | ORAL_TABLET | Freq: Every day | ORAL | 0 refills | Status: DC
Start: 2022-03-01 — End: 2023-12-26

## 2022-03-01 MED ORDER — FAMOTIDINE 40 MG PO TABS
40.0000 mg | ORAL_TABLET | Freq: Every day | ORAL | 0 refills | Status: DC
Start: 2022-03-01 — End: 2023-12-26

## 2022-03-01 NOTE — ED Triage Notes (Signed)
Patient is here for "insect bite" (underneath right upper arm) with swelling, redness "now having nausea".  ?

## 2022-03-01 NOTE — Discharge Instructions (Signed)
I am concerned that you have an infection.  Please go home and use a marker to demarcate the area of redness.  If this continues to spread despite medication you need to be seen immediately.  If you develop any fever, nausea, vomiting, headache, dizziness you need to be seen immediately.  Start doxycycline 100 mg twice daily.  Stay out of the sun while on this medicine.  Use Zyrtec in the morning and Pepcid at night to help with any allergic response.  Use cool compresses for pain.  You should follow-up with either primary care or our clinic within a few days.  If you are unable to see primary care you can return here.  If anything worsens you need to go to the emergency room. ?

## 2022-03-29 NOTE — Telephone Encounter (Signed)
Pt has an appt scheduled for June 6th at 8:45 am. Pt is asking if you have anything sooner due to her symptoms. Please advise ?

## 2022-04-11 DIAGNOSIS — D860 Sarcoidosis of lung: Secondary | ICD-10-CM | POA: Diagnosis not present

## 2022-04-11 DIAGNOSIS — I639 Cerebral infarction, unspecified: Secondary | ICD-10-CM | POA: Diagnosis not present

## 2022-04-11 DIAGNOSIS — Y900 Blood alcohol level of less than 20 mg/100 ml: Secondary | ICD-10-CM | POA: Insufficient documentation

## 2022-04-11 DIAGNOSIS — G4459 Other complicated headache syndrome: Secondary | ICD-10-CM | POA: Diagnosis not present

## 2022-04-11 DIAGNOSIS — E039 Hypothyroidism, unspecified: Secondary | ICD-10-CM | POA: Diagnosis not present

## 2022-04-11 DIAGNOSIS — K8020 Calculus of gallbladder without cholecystitis without obstruction: Secondary | ICD-10-CM | POA: Insufficient documentation

## 2022-04-11 DIAGNOSIS — F329 Major depressive disorder, single episode, unspecified: Secondary | ICD-10-CM | POA: Diagnosis not present

## 2022-04-11 DIAGNOSIS — F32A Depression, unspecified: Secondary | ICD-10-CM | POA: Diagnosis present

## 2022-04-11 DIAGNOSIS — E559 Vitamin D deficiency, unspecified: Secondary | ICD-10-CM | POA: Diagnosis not present

## 2022-04-11 DIAGNOSIS — G4730 Sleep apnea, unspecified: Secondary | ICD-10-CM | POA: Insufficient documentation

## 2022-04-11 DIAGNOSIS — R45851 Suicidal ideations: Secondary | ICD-10-CM | POA: Insufficient documentation

## 2022-04-11 HISTORY — DX: Depression, unspecified: F32.A

## 2022-04-11 HISTORY — DX: Anxiety disorder, unspecified: F41.9

## 2022-04-11 MED ORDER — FAMOTIDINE 20 MG PO TABS
40.0000 mg | ORAL_TABLET | Freq: Every day | ORAL | Status: DC
Start: 2022-04-11 — End: 2022-04-12

## 2022-04-11 MED ORDER — BREXPIPRAZOLE 1 MG PO TABS
1.0000 mg | ORAL_TABLET | Freq: Every day | ORAL | Status: DC
Start: 2022-04-11 — End: 2022-04-12

## 2022-04-11 NOTE — ED Notes (Signed)
VOL/pt accepted to [facility] after 9AM.

## 2022-04-11 NOTE — ED Triage Notes (Signed)
Patient reports she is suffering from depression and anxiety. Reports she doesn't want to hurt herself but states "I do not want to be here anymore. I do not think I could hurt myself but do not want to be living." Patient currently sees psychiatrist and is taking them as prescribed.

## 2022-04-11 NOTE — ED Notes (Signed)
Pt husband took pt belongings home. 

## 2022-04-11 NOTE — ED Notes (Signed)
Pt given meal tray and coke.  

## 2022-04-11 NOTE — BH Assessment (Signed)
Comprehensive Clinical Assessment (CCA) Note  04/11/2022 [name] [id number]  Chief Complaint:  Chief Complaint  Patient presents with   Mental Health Problem   Visit Diagnosis: Major Depression   Courtney Simpson is a 46 year old female who presents to the ER because both Courtney Simpson husband and herself are concerned about the state of Courtney Simpson emotional and mental health. Patient is starting to having thoughts of ending Courtney Simpson life. She states she don't want to do it but the thoughts have increased and they are becoming overwhelming. Courtney Simpson husband, states he works at night and he is now afraid to leave Courtney Simpson alone, because of the changes. He reports that he is unsure what she would do, because he has never seen Courtney Simpson like this. During the interview the patient was calm, cooperative and pleasant. She was able to provide the appropriate answers to the questions. She denies HI and AV/H. She also denies history of violence and involvement with the legal system.  CCA Screening, Triage and Referral (STR)  Patient Reported Information How did you hear about [place]? Family/Friend  What Is the Reason for Your Visit/Call Today? Brought to the ER due to having thoughts of ending Courtney Simpson life.  How Long Has This Been Causing You Problems? 1-6 months  What Do You Feel Would Help You the Most Today? Treatment for Depression or other mood problem   Have You Recently Had Any Thoughts About Hurting Yourself? Yes  Are You Planning to Commit Suicide/Harm Yourself At This time? No   Have you Recently Had Thoughts About Hurting Someone Karolee Ohs? No  Are You Planning to Harm Someone at This Time? No  Explanation: No data recorded  Have You Used Any Alcohol or Drugs in the Past 24 Hours? No  How Long Ago Did You Use Drugs or Alcohol? No data recorded What Did You Use and How Much? No data recorded  Do You Currently Have a Therapist/Psychiatrist? Yes  Name of Therapist/Psychiatrist: No data recorded  Have You Been  Recently Discharged From Any Office Practice or Programs? No  Explanation of Discharge From Practice/Program: No data recorded    CCA Screening Triage Referral Assessment Type of Contact: Face-to-Face  Telemedicine Service Delivery:   Is this Initial or Reassessment? No data recorded Date Telepsych consult ordered in CHL:  No data recorded Time Telepsych consult ordered in CHL:  No data recorded Location of Assessment: [facility] ED  Provider Location: [facility] ED   Collateral Involvement: No data recorded  Does Patient Have a Court Appointed Legal Guardian? No data recorded Name and Contact of Legal Guardian: No data recorded If Minor and Not Living with Parent(s), Who has Custody? No data recorded Is CPS involved or ever been involved? Never  Is APS involved or ever been involved? Never   Patient Determined To Be At Risk for Harm To Self or Others Based on Review of Patient Reported Information or Presenting Complaint? No  Method: No data recorded Availability of Means: No data recorded Intent: No data recorded Notification Required: No data recorded Additional Information for Danger to Others Potential: No data recorded Additional Comments for Danger to Others Potential: No data recorded Are There Guns or Other Weapons in Your Home? No data recorded Types of Guns/Weapons: No data recorded Are These Weapons Safely Secured?                            No data recorded Who Could Verify You Are Able  To Have These Secured: No data recorded Do You Have any Outstanding Charges, Pending Court Dates, Parole/Probation? No data recorded Contacted To Inform of Risk of Harm To Self or Others: No data recorded   Does Patient Present under Involuntary Commitment? No  IVC Papers Initial File Date: No data recorded  [place] of Residence: [place]   Patient Currently Receiving the Following Services: Medication Management   Determination of Need: Emergent (2 hours)   Options For  Referral: ED Visit; Inpatient Hospitalization     CCA Biopsychosocial Patient Reported Schizophrenia/Schizoaffective Diagnosis in Past: No   Strengths: Have support system, some insight, in treatment   Mental Health Symptoms Depression:   Fatigue; Difficulty Concentrating; Sleep (too much or little); Tearfulness   Duration of Depressive symptoms:    Mania:   None   Anxiety:    Worrying; Sleep   Psychosis:   None   Duration of Psychotic symptoms:    Trauma:   N/A   Obsessions:   N/A   Compulsions:   N/A   Inattention:   N/A   Hyperactivity/Impulsivity:   N/A   Oppositional/Defiant Behaviors:  No data recorded  Emotional Irregularity:   N/A   Other Mood/Personality Symptoms:  No data recorded   Mental Status Exam Appearance and self-care  Stature:   Average   Weight:   Average weight   Clothing:   Age-appropriate; Neat/clean   Grooming:   Normal   Cosmetic use:   None   Posture/gait:   Normal   Motor activity:   -- (Within normal range)   Sensorium  Attention:   Normal   Concentration:   Normal   Orientation:   X5   Recall/memory:   Normal   Affect and Mood  Affect:   Appropriate   Mood:   Depressed   Relating  Eye contact:   Normal   Facial expression:   Depressed; Responsive   Attitude toward examiner:   Cooperative   Thought and Language  Speech flow:  Clear and Coherent   Thought content:   Appropriate to Mood and Circumstances   Preoccupation:   None   Hallucinations:   None   Organization:  No data recorded  [employer] of Knowledge:   Fair   Intelligence:   Average   Abstraction:   Normal; Functional   Judgement:   Fair   Reality Testing:   Adequate   Insight:   Flashes of insight   Decision Making:   Normal   Social Functioning  Social Maturity:   Isolates   Social Judgement:   Impropriety; "Street Smart"   Stress  Stressors:   Other (Comment);  Transitions   Coping Ability:   Overwhelmed   Skill Deficits:   None   Supports:   Friends/Service system     Religion: Religion/Spirituality Are You A Religious Person?: No  Leisure/Recreation: Leisure / Recreation Do You Have Hobbies?: No  Exercise/Diet: Exercise/Diet Do You Exercise?: No Have You Gained or Lost A Significant Amount of Weight in the Past Six Months?: No Do You Follow a Special Diet?: No Do You Have Any Trouble Sleeping?: Yes Explanation of Sleeping Difficulties: Trouble falling and staying asleep.   CCA Employment/Education Employment/Work Situation: Employment / Work Clinical biochemist has Been Impacted by Current Illness: No Has Patient ever Been in the [employer]?: No  Education: Education Is Patient Currently Attending School?: No Did You Have An Individualized Education Program (IIEP): No Did You Have Any Difficulty At [employer]?: No  Patient's Education Has Been Impacted by Current Illness: No   CCA Family/Childhood History Family and Relationship History: Family history Marital status: Married Does patient have children?: Yes How many children?: 3 How is patient's relationship with their children?: "It's a good relationhip."  Childhood History:  Childhood History By whom was/is the patient raised?: Mother Did patient suffer any verbal/emotional/physical/sexual abuse as a child?: No Did patient suffer from severe childhood neglect?: No Has patient ever been sexually abused/assaulted/raped as an adolescent or adult?: No Was the patient ever a victim of a crime or a disaster?: No Witnessed domestic violence?: No Has patient been affected by domestic violence as an adult?: No  Child/Adolescent Assessment:     CCA Substance Use Alcohol/Drug Use: Alcohol / Drug Use Pain Medications: See PTA Prescriptions: See PTA Over the Counter: See PTA History of alcohol / drug use?: No history of alcohol / drug abuse Longest period of  sobriety (when/how long): n/a     ASAM's:  Six Dimensions of Multidimensional Assessment  Dimension 1:  Acute Intoxication and/or Withdrawal Potential:      Dimension 2:  Biomedical Conditions and Complications:      Dimension 3:  Emotional, Behavioral, or Cognitive Conditions and Complications:     Dimension 4:  Readiness to Change:     Dimension 5:  Relapse, Continued use, or Continued Problem Potential:     Dimension 6:  Recovery/Living Environment:     ASAM Severity Score:    ASAM Recommended Level of Treatment:     Substance use Disorder (SUD)    Recommendations for Services/Supports/Treatments:    Discharge Disposition:    DSM5 Diagnoses: Patient Active Problem List   Diagnosis Date Noted   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 09/07/2021   Urinary frequency 09/07/2021   Pelvic pain 09/07/2021   Complicated headache syndromes 11/01/2020   Stroke (HCC) 10/31/2020   Sarcoidosis of lung (HCC) 10/31/2020   Sarcoidosis 10/31/2020   Uveitis 10/31/2020   Obesity (BMI 30-39.9) 10/31/2020   Depression 10/31/2020   Sleep apnea in adult 10/31/2020   Hot flashes 09/22/2020   Vitamin D deficiency 11/27/2015   Gallstones 02/05/2014   Morbid obesity (HCC) 02/05/2014     Referrals to Alternative Service(s): Referred to Alternative Service(s):   Place:   Date:   Time:    Referred to Alternative Service(s):   Place:   Date:   Time:    Referred to Alternative Service(s):   Place:   Date:   Time:    Referred to Alternative Service(s):   Place:   Date:   Time:     Lilyan Gilfordalvin J. Yichen Gilardi MS, LCAS, [facility], [facility] Therapeutic Triage Specialist 04/11/2022 4:26 PM

## 2022-04-11 NOTE — Consult Note (Signed)
[facility] Face-to-Face Psychiatry Consult   Reason for Consult: Psychiatric Evaluation Referring Physician: Dr. Vicente Males Patient Identification: Courtney Simpson MRN:  [id number] Principal Diagnosis: <principal problem not specified> Diagnosis:  Active Problems:   Gallstones   Morbid obesity (HCC)   Vitamin D deficiency   Stroke (HCC)   Sarcoidosis of lung (HCC)   Depression   Sleep apnea in adult   Complicated headache syndromes   Total Time spent with patient: 1 hour  Subjective: "All I know I do not want to be here." Courtney Simpson is a 46 y.o. female patient presented to [facility] ED via POV with her husband at her side. The patient was given the choice to have her husband leave the room during her assessment or remain at the bedside. The patient voiced that having her husband stay at her side was okay. During the patient assessment, Mr. Zakyah Yanes [phone]) expressed that he did not think the patient would be safe if discharged. He states he works at night, and patient safety is a concern. The patient shared that her outpatient provider had her on Celexa 40 mg daily. She was then started on Rexulti 0.5 mg PO daily. The patient discussed that when she was still not feeling "okay," her outpatient provider told her to increase the medication to Rexulti 1 mg PO daily. The patient voiced the side effects became worse, and her doctor told her to stop taking the patient. The patient discussed this morning; she woke up feeling extremely bad. She discussed "not wanting to be here" on earth anymore. The patient share she does not have a plan, but if she were not alive, she would be happy. Her husband discussed that her thinking that way scares him. He shared that he works at night and is afraid that she might do something when he is at work.  This provider saw The patient face-to-face; the chart was reviewed, and consulted with Dr.Bradler on 04/11/2022 due to the patient's care. It was discussed with  the EDP that the patient does meet the criteria to be admitted to the psychiatric inpatient unit.  On evaluation, the patient is alert and oriented x4, calm, flat, cooperative, and mood-congruent with affect. The patient does not appear to be responding to internal or external stimuli. Neither is the patient presenting with any delusional thinking. The patient denies auditory or visual hallucinations. The patient admits to suicidal ideation but denies homicidal or self-harm ideations. The patient is not presenting with any psychotic or paranoid behaviors. During an encounter with the patient, she could answer questions appropriately.  HPI:   Past Psychiatric History:  Anxiety Depression  Risk to Self:   Risk to Others:   Prior Inpatient Therapy:   Prior Outpatient Therapy:    Past Medical History:  Past Medical History:  Diagnosis Date   Anxiety    Arthritis    Depression    Fatty liver    GERD (gastroesophageal reflux disease)    Hyperlipidemia    Hypertension    Sarcoidosis    Sarcoidosis     Past Surgical History:  Procedure Laterality Date   ANTERIOR CRUCIATE LIGAMENT REPAIR Bilateral    back surgery     CHOLECYSTECTOMY     COLONOSCOPY WITH PROPOFOL N/A 02/23/2016   Procedure: COLONOSCOPY WITH PROPOFOL;  Surgeon: Christena Deem, MD;  Location: [facility] ENDOSCOPY;  Service: Endoscopy;  Laterality: N/A;   ESOPHAGOGASTRODUODENOSCOPY (EGD) WITH PROPOFOL N/A 02/23/2016   Procedure: ESOPHAGOGASTRODUODENOSCOPY (EGD) WITH PROPOFOL;  Surgeon: Cindra Eves  Marva Panda, MD;  Location: [facility] ENDOSCOPY;  Service: Endoscopy;  Laterality: N/A;   GASTRIC BYPASS  03/2020   Family History:  Family History  Problem Relation Age of Onset   Cancer Paternal Grandmother        colon   Cancer Paternal Grandfather        colon   Breast cancer Neg Hx    Family Psychiatric  History:  Social History:  Social History   Substance and Sexual Activity  Alcohol Use No     Social History   Substance  and Sexual Activity  Drug Use No    Social History   Socioeconomic History   Marital status: Married    Spouse name: Not on file   Number of children: Not on file   Years of education: Not on file   Highest education level: Not on file  Occupational History   Not on file  Tobacco Use   Smoking status: Never   Smokeless tobacco: Never  Vaping Use   Vaping Use: Every day   Substances: Nicotine  Substance and Sexual Activity   Alcohol use: No   Drug use: No   Sexual activity: Yes    Birth control/protection: I.U.D.    Comment: mirena  Other Topics Concern   Not on file  Social History Narrative   Not on file   Social Determinants of Health   Financial Resource Strain: Not on file  Food Insecurity: Not on file  Transportation Needs: Not on file  Physical Activity: Not on file  Stress: Not on file  Social Connections: Not on file   Additional Social History:    Allergies:   Allergies  Allergen Reactions   Eggs Or Egg-Derived Products Rash    Egg whites   Other Anaphylaxis    Nuts   Misc. Sulfonamide Containing Compounds    Sulfamethoxazole    Trimethoprim    Adhesive [Tape] Dermatitis    Blisters   Septra [Sulfamethoxazole-Trimethoprim] Rash   Sulfa Antibiotics Rash   Vancomycin Rash    Labs:  Results for orders placed or performed during the hospital encounter of 04/11/22 (from the past 48 hour(s))  Comprehensive metabolic panel     Status: Abnormal   Collection Time: 04/11/22 12:04 PM  Result Value Ref Range   Sodium 134 (L) 135 - 145 mmol/L   Potassium 2.5 (LL) 3.5 - 5.1 mmol/L    Comment: CRITICAL RESULT CALLED TO, READ BACK BY AND VERIFIED WITH TIFFANY JOHNSON  04/11/22 MJU    Chloride 100 98 - 111 mmol/L   CO2 27 22 - 32 mmol/L   Glucose, Bld 97 70 - 99 mg/dL    Comment: Glucose reference range applies only to samples taken after fasting for at least 8 hours.   BUN <5 (L) 6 - 20 mg/dL   Creatinine, Ser 1.61 0.44 - 1.00 mg/dL   Calcium  8.4 (L) 8.9 - 10.3 mg/dL   Total Protein 7.7 6.5 - 8.1 g/dL   Albumin 3.8 3.5 - 5.0 g/dL   AST 26 15 - 41 U/L   ALT 15 0 - 44 U/L   Alkaline Phosphatase 116 38 - 126 U/L   Total Bilirubin 0.4 0.3 - 1.2 mg/dL   GFR, Estimated >09 >60 mL/min    Comment: (NOTE) Calculated using the CKD-EPI Creatinine Equation (2021)    Anion gap 7 5 - 15    Comment: Performed at [facility], [address] [place], [place] [address]  Ethanol  Status: None   Collection Time: 04/11/22 12:04 PM  Result Value Ref Range   Alcohol, Ethyl (B) <10 <10 mg/dL    Comment: (NOTE) Lowest detectable limit for serum alcohol is 10 mg/dL.  For medical purposes only. Performed at [facility], [address] [place], [place] [address]   Salicylate level     Status: Abnormal   Collection Time: 04/11/22 12:04 PM  Result Value Ref Range   Salicylate Lvl <7.0 (L) 7.0 - 30.0 mg/dL    Comment: Performed at [facility], [address] [place], [place] [address]  Acetaminophen level     Status: Abnormal   Collection Time: 04/11/22 12:04 PM  Result Value Ref Range   Acetaminophen (Tylenol), Serum <10 (L) 10 - 30 ug/mL    Comment: (NOTE) Therapeutic concentrations vary significantly. A range of 10-30 ug/mL  may be an effective concentration for many patients. However, some  are best treated at concentrations outside of this range. Acetaminophen concentrations >150 ug/mL at 4 hours after ingestion  and >50 ug/mL at 12 hours after ingestion are often associated with  toxic reactions.  Performed at [facility], [address] [place], [place] [address]   cbc     Status: Abnormal   Collection Time: 04/11/22 12:04 PM  Result Value Ref Range   WBC 6.4 4.0 - 10.5 K/uL   RBC 4.74 3.87 - 5.11 MIL/uL   Hemoglobin 10.9 (L) 12.0 - 15.0 g/dL   HCT 28.4 13.2 - 44.0 %   MCV 77.0 (L) 80.0 - 100.0 fL   MCH 23.0 (L) 26.0 - 34.0 pg   MCHC 29.9 (L) 30.0 - 36.0 g/dL   RDW 10.2 72.5  - 36.6 %   Platelets 387 150 - 400 K/uL   nRBC 0.0 0.0 - 0.2 %    Comment: Performed at [facility], [address] [place], [place] [address]  Urine Drug Screen, Qualitative     Status: None   Collection Time: 04/11/22 12:04 PM  Result Value Ref Range   Tricyclic, Ur Screen NONE DETECTED NONE DETECTED   Amphetamines, Ur Screen NONE DETECTED NONE DETECTED   MDMA (Ecstasy)Ur Screen NONE DETECTED NONE DETECTED   Cocaine Metabolite,Ur [place] NONE DETECTED NONE DETECTED   Opiate, Ur Screen NONE DETECTED NONE DETECTED   Phencyclidine (PCP) Ur S NONE DETECTED NONE DETECTED   Cannabinoid 50 Ng, Ur [place] NONE DETECTED NONE DETECTED   Barbiturates, Ur Screen NONE DETECTED NONE DETECTED   Benzodiazepine, Ur Scrn NONE DETECTED NONE DETECTED   Methadone Scn, Ur NONE DETECTED NONE DETECTED    Comment: (NOTE) Tricyclics + metabolites, urine    Cutoff 1000 ng/mL Amphetamines + metabolites, urine  Cutoff 1000 ng/mL MDMA (Ecstasy), urine              Cutoff 500 ng/mL Cocaine Metabolite, urine          Cutoff 300 ng/mL Opiate + metabolites, urine        Cutoff 300 ng/mL Phencyclidine (PCP), urine         Cutoff 25 ng/mL Cannabinoid, urine                 Cutoff 50 ng/mL Barbiturates + metabolites, urine  Cutoff 200 ng/mL Benzodiazepine, urine              Cutoff 200 ng/mL Methadone, urine                   Cutoff 300 ng/mL  The urine drug screen provides only a preliminary, unconfirmed analytical test result and should not be used for non-medical purposes. Clinical consideration and professional judgment should be applied to any positive drug screen result due to possible interfering substances. A more specific alternate chemical method must be used in order to obtain a confirmed analytical result. Gas chromatography / mass spectrometry (GC/MS) is the preferred confirm atory method. Performed at [facility], [address] [place], [place] [address]     No current  facility-administered medications for this encounter.   Current Outpatient Medications  Medication Sig Dispense Refill   cetirizine (ZYRTEC ALLERGY) 10 MG tablet Take 1 tablet (10 mg total) by mouth daily. 30 tablet 0   citalopram (CELEXA) 40 MG tablet Take 40 mg by mouth daily.     famotidine (PEPCID) 40 MG tablet Take 1 tablet (40 mg total) by mouth at bedtime. 30 tablet 0   folic acid (FOLVITE) 1 MG tablet Take 1 mg by mouth daily.     hydrOXYzine (VISTARIL) 25 MG capsule Take 25 mg by mouth 2 (two) times daily.     ibuprofen (ADVIL) 800 MG tablet Take 800 mg by mouth every 6 (six) hours as needed.     losartan (COZAAR) 100 MG tablet Take 100 mg by mouth daily.     methotrexate (RHEUMATREX) 2.5 MG tablet Take 10 mg by mouth once a week. Caution:Chemotherapy. Protect from light. O n sat     REXULTI 1 MG TABS tablet Take 1 mg by mouth daily.     ARIPiprazole (ABILIFY) 5 MG tablet Take 5 mg by mouth daily. (Patient not taking: Reported on 04/11/2022)     citalopram (CELEXA) 20 MG tablet Take 20 mg by mouth daily. (Patient not taking: Reported on 04/11/2022)     cyanocobalamin (,VITAMIN B-12,) 1000 MCG/ML injection INJECT 1 ML (1000 MCG) INTO THE MUSCLE MONTHLY (Patient not taking: Reported on 04/11/2022)     doxycycline (VIBRAMYCIN) 100 MG capsule Take 1 capsule (100 mg total) by mouth 2 (two) times daily. (Patient not taking: Reported on 04/11/2022) 20 capsule 0   Flibanserin (ADDYI) 100 MG TABS Take 100 mg by mouth at bedtime. (Patient not taking: Reported on 04/11/2022) 30 tablet 2   FLUoxetine (PROZAC) 40 MG capsule Take 40 mg by mouth daily. (Patient not taking: Reported on 11/24/2021)     levonorgestrel (MIRENA) 20 MCG/24HR IUD 1 each by Intrauterine route once.     losartan (COZAAR) 50 MG tablet Take 50 mg by mouth daily. (Patient not taking: Reported on 04/11/2022)     methotrexate 250 MG/10ML injection Inject 25 mg into the skin once a week. Thursday (Patient not taking: Reported on 04/11/2022)      pantoprazole (PROTONIX) 40 MG tablet Take 40 mg by mouth daily. (Patient not taking: Reported on 11/24/2021)     potassium chloride (KLOR-CON 10) 10 MEQ tablet Take 1 tablet (10 mEq total) by mouth daily for 5 days. 5 tablet 0   prochlorperazine (COMPAZINE) 5 MG tablet Take 1 tablet (5 mg total) by mouth every 8 (eight) hours as needed (headache). (Patient not taking: Reported on 11/24/2021) 20 tablet 0   REXULTI 0.5 MG TABS Take 1 tablet by mouth daily. (Patient not taking: Reported on 04/11/2022)     rOPINIRole (REQUIP) 0.25 MG tablet Take 0.25 mg by mouth 2 (two) times daily. (Patient not taking: Reported on 04/11/2022)     sertraline (ZOLOFT) 100 MG tablet Take 100 mg by mouth 2 (two) times  daily. (Patient not taking: Reported on 11/24/2021)     topiramate (TOPAMAX) 100 MG tablet Pt reported not taking. (Patient not taking: Reported on 11/24/2021)     traMADol (ULTRAM) 50 MG tablet Take 50 mg by mouth every 6 (six) hours as needed. (Patient not taking: Reported on 04/11/2022)     venlafaxine XR (EFFEXOR-XR) 150 MG 24 hr capsule Take 150 mg by mouth daily. (Patient not taking: Reported on 11/24/2021)      Musculoskeletal: Strength & Muscle Tone: within normal limits Gait & Station: normal Patient leans: N/A  Psychiatric Specialty Exam:  Presentation  General Appearance: Appropriate for Environment  Eye Contact:Good  Speech:Clear and Coherent  Speech Volume:Decreased  Handedness:Right   Mood and Affect  Mood:Depressed  Affect:Blunt; Depressed; Flat   Thought Process  Thought Processes:Coherent  Descriptions of Associations:Intact  Orientation:Full (Time, Place and Person)  Thought Content:Logical  History of Schizophrenia/Schizoaffective disorder:No data recorded Duration of Psychotic Symptoms:No data recorded Hallucinations:Hallucinations: None  Ideas of Reference:None  Suicidal Thoughts:Suicidal Thoughts: Yes, Passive SI Passive Intent and/or Plan: Without Intent;  Without Plan; Without Means to Carry Out  Homicidal Thoughts:Homicidal Thoughts: No   Sensorium  Memory:Immediate Good; Recent Good; Remote Good  Judgment:Poor  Insight:Fair   Executive Functions  Concentration:Good  Attention Span:Fair  Recall:Good  Fund of Knowledge:Good  Language:Good   Psychomotor Activity  Psychomotor Activity:Psychomotor Activity: Normal   Assets  Assets:Communication Skills; Desire for Improvement; Resilience; Social Support   Sleep  Sleep:Sleep: Poor Number of Hours of Sleep: 4   Physical Exam: Physical Exam Vitals and nursing note reviewed.  Constitutional:      Appearance: Normal appearance. She is obese.  HENT:     Head: Normocephalic and atraumatic.     Right Ear: External ear normal.     Left Ear: External ear normal.     Nose: Nose normal.     Mouth/Throat:     Mouth: Mucous membranes are moist.  Cardiovascular:     Rate and Rhythm: Normal rate.     Pulses: Normal pulses.  Pulmonary:     Effort: Pulmonary effort is normal.  Musculoskeletal:        General: Normal range of motion.     Cervical back: Normal range of motion and neck supple.  Neurological:     General: No focal deficit present.     Mental Status: She is alert and oriented to person, place, and time.  Psychiatric:        Attention and Perception: Attention and perception normal.        Mood and Affect: Mood is anxious and depressed. Affect is blunt and flat.        Behavior: Behavior is cooperative.        Thought Content: Thought content includes suicidal ideation.        Cognition and Memory: Cognition normal.   Review of Systems  Psychiatric/Behavioral:  Positive for depression and suicidal ideas. The patient is nervous/anxious and has insomnia.   All other systems reviewed and are negative. Blood pressure (!) 171/98, pulse 72, temperature 99 F (37.2 C), temperature source Oral, resp. rate 16, height 5\' 2"  (1.575 m), weight 78.9 kg, SpO2 100 %. Body  mass index is 31.83 kg/m.  Treatment Plan Summary: Plan Patient does meet criteria for psychiatric inpatient admission  Disposition: Recommend psychiatric Inpatient admission when medically cleared. Supportive therapy provided about ongoing stressors.  , NP 04/11/2022 2:56 PM

## 2022-04-11 NOTE — BH Assessment (Signed)
Patient has been accepted to [facility].  Patient assigned to [facility]. Accepting physician is Dr. Pervis Hocking, PA.  Call report to [phone].   ER Staff is aware of it:  Melody, ER Secretary  Dr. Vicente Males, ER MD  Dahlia Client, Patient's Nurse     Patient's bed available at 9 AM.

## 2022-04-11 NOTE — BH Assessment (Signed)
Referral information for Psychiatric Hospitalization faxed to;   [facility] ([phone]-or- [phone]),   [facility] ([phone]---[phone]),  [facility] ([phone], [phone], [phone] or [phone]),   [facility] ([phone] or [phone])  [facility] ([phone]),   [facility] ([phone] -or- [phone]),   [facility] ([phone])  [facility] ([phone]).  [facility] ([phone]) 

## 2022-04-11 NOTE — ED Notes (Signed)
Pt asleep, snack not offered at this time.  

## 2022-04-12 DIAGNOSIS — F329 Major depressive disorder, single episode, unspecified: Secondary | ICD-10-CM | POA: Diagnosis not present

## 2022-04-12 NOTE — ED Notes (Signed)
Lunch given to patient.

## 2022-04-12 NOTE — ED Notes (Signed)
Consent to transfer on paper signed and witnessed.

## 2022-04-12 NOTE — ED Provider Notes (Signed)
[facility] Provider Note   Event Date/Time   First MD Initiated Contact with Patient 04/11/22 1242     (approximate) History  Mental Health Problem  HPI Courtney Simpson is a 46 y.o. female with a stated past medical history of anxiety/depression who presents complaining of worsening feelings of depression and anxiety including suicidal ideation.  Patient was stating in triage that "I do not want to be here anymore.  I do not think I could hurt myself but did not want to be living".  Patient states that she does see a psychiatrist and is taking medications on time and as prescribed.  Patient endorses life stressors for being a cause of her increased depression/anxiety.  Patient currently denies any homicidal ideation or auditory/visual hallucinations. Physical Exam  Triage Vital Signs: ED Triage Vitals [04/11/22 1204]  Enc Vitals Group     BP (!) 171/98     Pulse Rate 72     Resp 16     Temp 99 F (37.2 C)     Temp Source Oral     SpO2 100 %     Weight 174 lb (78.9 kg)     Height 5\' 2"  (1.575 m)     Head Circumference      Peak Flow      Pain Score 0     Pain Loc      Pain Edu?      Excl. in GC?    Most recent vital signs: Vitals:   04/11/22 1638 04/12/22 0727  BP: (!) 164/78 (!) 144/89  Pulse: 78 (!) 57  Resp: 19 14  Temp: 98.4 F (36.9 C) 98.7 F (37.1 C)  SpO2: 98% 96%   General: Awake, oriented x4. CV:  Good peripheral perfusion.  Resp:  Normal effort.  Abd:  No distention.  Other:  Middle-aged obese Caucasian female laying in bed in no distress ED Results / Procedures / Treatments  Labs (all labs ordered are listed, but only abnormal results are displayed) Labs Reviewed  COMPREHENSIVE METABOLIC PANEL - Abnormal; Notable for the following components:      Result Value   Sodium 134 (*)    Potassium 2.5 (*)    BUN <5 (*)    Calcium 8.4 (*)    All other components within normal limits  SALICYLATE LEVEL - Abnormal; Notable for the  following components:   Salicylate Lvl <7.0 (*)    All other components within normal limits  ACETAMINOPHEN LEVEL - Abnormal; Notable for the following components:   Acetaminophen (Tylenol), Serum <10 (*)    All other components within normal limits  CBC - Abnormal; Notable for the following components:   Hemoglobin 10.9 (*)    MCV 77.0 (*)    MCH 23.0 (*)    MCHC 29.9 (*)    All other components within normal limits  ETHANOL  URINE DRUG SCREEN, QUALITATIVE ([facility] ONLY)  POC URINE PREG, ED   PROCEDURES: Critical Care performed: No Procedures MEDICATIONS ORDERED IN ED: Medications  loratadine (CLARITIN) tablet 10 mg (10 mg Oral Given 04/12/22 0953)  citalopram (CELEXA) tablet 40 mg (40 mg Oral Given 04/12/22 0952)  famotidine (PEPCID) tablet 40 mg (40 mg Oral Given 04/11/22 2353)  folic acid (FOLVITE) tablet 1 mg (1 mg Oral Given 04/12/22 0951)  hydrOXYzine (ATARAX) tablet 25 mg (25 mg Oral Given 04/12/22 0953)  ibuprofen (ADVIL) tablet 800 mg (has no administration in time range)  losartan (COZAAR) tablet 100 mg (100  mg Oral Given 04/12/22 0951)  brexpiprazole (REXULTI) tablet 1 mg (1 mg Oral Given 04/12/22 2683)   IMPRESSION / MDM / ASSESSMENT AND PLAN / ED COURSE  I reviewed the triage vital signs and the nursing notes.                             Patient a 46 year old female with a past medical history of depression anxiety presents for worsening depression and stated suicidal ideation Thoughts are linear and organized, and patient has no AH, VH, or HI. Prior suicide attempt: Denies Prior Psychiatric Hospitalizations: Denies  Clinically patient displays no overt toxidrome; they are well appearing, with low suspicion for toxic ingestion given history and exam. Thoughts unlikely 2/2 anemia, hypothyroidism, infection, or ICH.  Consult: Psychiatry to evaluate patient for potential hold for danger to self. Disposition: Plan admit to psychiatry for further management of symptoms.     FINAL CLINICAL IMPRESSION(S) / ED DIAGNOSES   Final diagnoses:  Depression, unspecified depression type   Rx / DC Orders   ED Discharge Orders     None      Note:  This document was prepared using Dragon voice recognition software and may include unintentional dictation errors.   Merwyn Katos, MD 04/12/22 [id number]

## 2022-04-12 NOTE — ED Notes (Signed)
Safe Transport  called  for  pt  going  to  [facility] 

## 2022-04-28 DIAGNOSIS — R55 Syncope and collapse: Secondary | ICD-10-CM | POA: Insufficient documentation

## 2022-04-28 DIAGNOSIS — R519 Headache, unspecified: Secondary | ICD-10-CM

## 2022-04-28 NOTE — ED Provider Notes (Signed)
[facility] Provider Note    Event Date/Time   First MD Initiated Contact with Patient 04/28/22 1512     (approximate)   History   Loss of Consciousness   HPI  Courtney Simpson is a 46 y.o. female  who presents to the emergency department today at the advice of her therapist after suffering syncopal episode last night and hitting her head. The patient had taken her nighttime medication and then, instead of going to bed which is her normal routine, she stayed up to play a card game. While she was playing she started feeling light headed and passed out. As family was attempting to get her to bed she then passed out two more times, one time hitting her head. When the patient woke up she still felt somewhat light headed, and had a headache and a knot to her right forehead. When she spoke to her therapist the therapist recommended she come to the ED to be evaluated. The patient denies any chest pain or palpitations.  Physical Exam   Triage Vital Signs: ED Triage Vitals  Enc Vitals Group     BP 04/28/22 1234 129/74     Pulse Rate 04/28/22 1234 77     Resp 04/28/22 1234 20     Temp 04/28/22 1234 98.4 F (36.9 C)     Temp Source 04/28/22 1234 Oral     SpO2 04/28/22 1234 99 %     Weight 04/28/22 1234 170 lb (77.1 kg)     Height 04/28/22 1234 5\' 2"  (1.575 m)     Head Circumference --      Peak Flow --      Pain Score 04/28/22 1247 7   Most recent vital signs: Vitals:   04/28/22 1234  BP: 129/74  Pulse: 77  Resp: 20  Temp: 98.4 F (36.9 C)  SpO2: 99%    General: Awake, alert.  CV:  Good peripheral perfusion. Regular rate and rhythm. Resp:  Normal effort. Lungs clear to auscultation. Abd:  No distention.    ED Results / Procedures / Treatments   Labs (all labs ordered are listed, but only abnormal results are displayed) Labs Reviewed  BASIC METABOLIC PANEL - Abnormal; Notable for the following components:      Result Value   CO2 21 (*)     Calcium 8.5 (*)    All other components within normal limits  CBC - Abnormal; Notable for the following components:   Hemoglobin 10.7 (*)    MCV 78.8 (*)    MCH 22.9 (*)    MCHC 29.0 (*)    RDW 15.7 (*)    All other components within normal limits  URINALYSIS, ROUTINE W REFLEX MICROSCOPIC  POC URINE PREG, ED     EKG  I, Nance Pear, attending physician, personally viewed and interpreted this EKG  EKG Time: 1237 Rate: 77 Rhythm: normal sinus rhythm Axis: normal Intervals: qtc 482 QRS: narrow ST changes: no st elevation Impression: abnormal ekg  RADIOLOGY I independently interpreted and visualized the CT head. My interpretation: No acute bleed.  Radiology interpretation:  IMPRESSION:  Normal head CT    I independently interpreted and visualized the CT cervical spine. My interpretation: No acute fracture Radiology interpretation:  IMPRESSION:  No acute or traumatic finding. Chronic degenerative changes at the  articulation of the skull base, C1 and C2. Mild chronic facet  arthritis at C2-3 on the right. Distant ACDF C5-C7 has a good  appearance.  I independently interpreted and visualized the Lumbar spine. My interpretation: No acute fracture Radiology interpretation:  IMPRESSION:  No fracture or dislocation of the lumbar spine. Mild multilevel  lumbar disc degenerative disease of the lower levels.    PROCEDURES:  Critical Care performed: No  Procedures   MEDICATIONS ORDERED IN ED: Medications - No data to display   IMPRESSION / MDM / Mark / ED COURSE  I reviewed the triage vital signs and the nursing notes.                              Differential diagnosis includes, but is not limited to, intracranial bleed, fracture.  Patient's presentation is most consistent with acute presentation with potential threat to life or bodily function.  Patient presented to the emergency department today because of concerns for injuries and  syncopal episode that occurred last night.  On my exam patient is awake and alert.  Imaging of the head C-spine and lumbar spine without acute fracture or concerning intracranial abnormality.  Patient's blood work without significant anemia or concerning electrolyte abnormality.  EKG showed slightly prolonged QTc although it has improved from previous EKG.  Family states that there is a long history of syncope and patient has syncopized in the past.  At this time I have low suspicion for torsades causing the patient's syncope.  She felt significant improvement after Fioricet.  Given that patient feels clinically improved I think it is reasonable for patient to be discharged.  Discussed with patient importance of following up with primary care. Discussed return precautions.  FINAL CLINICAL IMPRESSION(S) / ED DIAGNOSES   Final diagnoses:  Syncope, unspecified syncope type  Bad headache     Note:  This document was prepared using Dragon voice recognition software and may include unintentional dictation errors.    Nance Pear, MD 04/28/22 [phone]

## 2022-04-28 NOTE — ED Triage Notes (Signed)
Pt reports that she had 3 syncopal episodes last night, pt reports that she felt light headed and then doesn't remember anything after that. Pt reports that she recently placed on new meds, wellbutrin, seroquel and med for restless leg. Pt reports she hit rt side of forehead with fall.

## 2022-04-28 NOTE — Discharge Instructions (Signed)
Please seek medical attention for any high fevers, chest pain, shortness of breath, change in behavior, persistent vomiting, bloody stool or any other new or concerning symptoms.  

## 2023-09-19 DIAGNOSIS — F419 Anxiety disorder, unspecified: Secondary | ICD-10-CM | POA: Diagnosis not present

## 2023-09-19 DIAGNOSIS — E785 Hyperlipidemia, unspecified: Secondary | ICD-10-CM | POA: Diagnosis not present

## 2023-09-19 DIAGNOSIS — K219 Gastro-esophageal reflux disease without esophagitis: Secondary | ICD-10-CM

## 2023-09-19 DIAGNOSIS — R55 Syncope and collapse: Secondary | ICD-10-CM

## 2023-09-19 DIAGNOSIS — Z9884 Bariatric surgery status: Secondary | ICD-10-CM | POA: Insufficient documentation

## 2023-09-19 DIAGNOSIS — D509 Iron deficiency anemia, unspecified: Secondary | ICD-10-CM

## 2023-09-19 DIAGNOSIS — D869 Sarcoidosis, unspecified: Secondary | ICD-10-CM | POA: Diagnosis not present

## 2023-09-19 DIAGNOSIS — I10 Essential (primary) hypertension: Secondary | ICD-10-CM | POA: Diagnosis not present

## 2023-09-19 DIAGNOSIS — D649 Anemia, unspecified: Secondary | ICD-10-CM

## 2023-09-19 NOTE — Progress Notes (Signed)
Patient has been having fainting spells for almost a year now. She is supposed to have another sleep study done by Dr. Mervyn Skeeters soon to see if she should be on a CPAP machine or not.

## 2023-09-19 NOTE — Progress Notes (Signed)
[facility]  [phone] [phone]) [phone]  ID: Courtney Simpson OB: Mar 15, 1976  MR#: [id number]  DGU#:[id number]  Patient Care Team: Lynnea Ferrier, MD as PCP - General (Internal Medicine) Oh, Ezzard Standing, MD (Inactive) as Referring Physician (Internal Medicine) Lemar Livings Merrily Pew, MD as Consulting Physician (General Surgery)  REFERRING PROVIDER: Manson Allan, NP  REASON FOR REFERRAL: Iron deficiency anemia  HPI: Courtney Simpson is a 47 y.o. female with past medical history of anxiety, GERD, hyperlipidemia, hypertension, sarcoidosis previously on methotrexate referred to hematology for iron deficiency anemia not responding to oral iron.  Patient having frequent syncopal episodes in the past 1 year. Seen by Dr. Melton Alar from cardiology.  Had Holter monitor which showed short run of VT.  Echo was normal.  There was a concern it may be related to OSA.  She is waiting to get sleep study.  Diagnosed with iron deficiency in August 2024.  Ferritin of 5 and hemoglobin of 9.7.  Started on Slow Fe 2 tabs a day.  Repeat labs done on 10/24 did not show any improvement.  So she was referred to hematology for IV iron.  Seen by Dr. Kathryne Simpson, GI and is scheduled for colonoscopy and endoscopy after Christmas.  Denies any bleeding in urine or stools.  Stool occult was negative.  Denies any vaginal bleeding.  Occasional spotting.  Mirena in place.  Gastric bypass surgery in 2017.   REVIEW OF SYSTEMS:   ROS  As per HPI. Otherwise, a complete review of systems is negative.  PAST MEDICAL HISTORY: Past Medical History:  Diagnosis Date   Anxiety    Arthritis    Depression    Fatty liver    GERD (gastroesophageal reflux disease)    Hyperlipidemia    Hypertension    Sarcoidosis    Sarcoidosis     PAST SURGICAL HISTORY: Past Surgical History:  Procedure Laterality Date   ANTERIOR CRUCIATE LIGAMENT REPAIR Bilateral    back surgery     CHOLECYSTECTOMY      COLONOSCOPY WITH PROPOFOL N/A 02/23/2016   Procedure: COLONOSCOPY WITH PROPOFOL;  Surgeon: Christena Deem, MD;  Location: [facility] ENDOSCOPY;  Service: Endoscopy;  Laterality: N/A;   ESOPHAGOGASTRODUODENOSCOPY (EGD) WITH PROPOFOL N/A 02/23/2016   Procedure: ESOPHAGOGASTRODUODENOSCOPY (EGD) WITH PROPOFOL;  Surgeon: Christena Deem, MD;  Location: [facility] ENDOSCOPY;  Service: Endoscopy;  Laterality: N/A;   GASTRIC BYPASS  03/2020    FAMILY HISTORY: Family History  Problem Relation Age of Onset   Cancer Paternal Grandmother        colon   Cancer Paternal Grandfather        colon   Breast cancer Neg Hx     HEALTH MAINTENANCE: Social History   Tobacco Use   Smoking status: Never   Smokeless tobacco: Never  Vaping Use   Vaping status: Every Day   Substances: Nicotine  Substance Use Topics   Alcohol use: No   Drug use: No     Allergies  Allergen Reactions   Egg-Derived Products Rash    Egg whites   Other Anaphylaxis    Nuts   Misc. Sulfonamide Containing Compounds    Sulfamethoxazole    Trimethoprim    Adhesive [Tape] Dermatitis    Blisters   Septra [Sulfamethoxazole-Trimethoprim] Rash   Sulfa Antibiotics Rash   Vancomycin Rash    Current Outpatient Medications  Medication Sig Dispense Refill   cetirizine (ZYRTEC ALLERGY) 10 MG tablet Take 1 tablet (10 mg total) by mouth daily.  30 tablet 0   citalopram (CELEXA) 40 MG tablet Take 40 mg by mouth daily.     folic acid (FOLVITE) 1 MG tablet Take 1 mg by mouth daily.     hydrOXYzine (VISTARIL) 25 MG capsule Take 25 mg by mouth 2 (two) times daily.     levonorgestrel (MIRENA) 20 MCG/24HR IUD 1 each by Intrauterine route once.     pantoprazole (PROTONIX) 40 MG tablet Take 40 mg by mouth daily.     prochlorperazine (COMPAZINE) 5 MG tablet Take 1 tablet (5 mg total) by mouth every 8 (eight) hours as needed (headache). 20 tablet 0   ARIPiprazole (ABILIFY) 5 MG tablet Take 5 mg by mouth daily. (Patient not taking: Reported on  04/11/2022)     cyanocobalamin (,VITAMIN B-12,) 1000 MCG/ML injection INJECT 1 ML (1000 MCG) INTO THE MUSCLE MONTHLY (Patient not taking: Reported on 04/11/2022)     famotidine (PEPCID) 40 MG tablet Take 1 tablet (40 mg total) by mouth at bedtime. (Patient not taking: Reported on 09/19/2023) 30 tablet 0   Flibanserin (ADDYI) 100 MG TABS Take 100 mg by mouth at bedtime. (Patient not taking: Reported on 04/11/2022) 30 tablet 2   FLUoxetine (PROZAC) 40 MG capsule Take 40 mg by mouth daily. (Patient not taking: Reported on 11/24/2021)     ibuprofen (ADVIL) 800 MG tablet Take 800 mg by mouth every 6 (six) hours as needed. (Patient not taking: Reported on 09/19/2023)     losartan (COZAAR) 100 MG tablet Take 100 mg by mouth daily. (Patient not taking: Reported on 09/19/2023)     losartan (COZAAR) 50 MG tablet Take 50 mg by mouth daily. (Patient not taking: Reported on 04/11/2022)     methotrexate (RHEUMATREX) 2.5 MG tablet Take 10 mg by mouth once a week. Caution:Chemotherapy. Protect from light. O n sat (Patient not taking: Reported on 09/19/2023)     methotrexate 250 MG/10ML injection Inject 25 mg into the skin once a week. Thursday (Patient not taking: Reported on 04/11/2022)     REXULTI 1 MG TABS tablet Take 1 mg by mouth daily. (Patient not taking: Reported on 09/19/2023)     rOPINIRole (REQUIP) 0.25 MG tablet Take 0.25 mg by mouth 2 (two) times daily. (Patient not taking: Reported on 04/11/2022)     sertraline (ZOLOFT) 100 MG tablet Take 100 mg by mouth 2 (two) times daily. (Patient not taking: Reported on 11/24/2021)     topiramate (TOPAMAX) 100 MG tablet Pt reported not taking. (Patient not taking: Reported on 11/24/2021)     traMADol (ULTRAM) 50 MG tablet Take 50 mg by mouth every 6 (six) hours as needed. (Patient not taking: Reported on 04/11/2022)     venlafaxine XR (EFFEXOR-XR) 150 MG 24 hr capsule Take 150 mg by mouth daily. (Patient not taking: Reported on 11/24/2021)     No current facility-administered  medications for this visit.    OBJECTIVE: Vitals:   09/19/23 1107  BP: 123/83  Pulse: 63  Temp: 99.1 F (37.3 C)  SpO2: 100%     Body mass index is 35.48 kg/m.      General: Well-developed, well-nourished, no acute distress. Eyes: Pink conjunctiva, anicteric sclera. HEENT: Normocephalic, moist mucous membranes, clear oropharnyx. Lungs: Clear to auscultation bilaterally. Heart: Regular rate and rhythm. No rubs, murmurs, or gallops. Abdomen: Soft, nontender, nondistended. No organomegaly noted, normoactive bowel sounds. Musculoskeletal: No edema, cyanosis, or clubbing. Neuro: Alert, answering all questions appropriately. Cranial nerves grossly intact. Skin: No rashes or petechiae noted. Psych: Normal  affect. Lymphatics: No cervical, calvicular, axillary or inguinal LAD.   LAB RESULTS:  Lab Results  Component Value Date   NA 137 04/28/2022   K 4.1 04/28/2022   CL 108 04/28/2022   CO2 21 (L) 04/28/2022   GLUCOSE 92 04/28/2022   BUN 6 04/28/2022   CREATININE 0.91 04/28/2022   CALCIUM 8.5 (L) 04/28/2022   PROT 7.7 04/11/2022   ALBUMIN 3.8 04/11/2022   AST 26 04/11/2022   ALT 15 04/11/2022   ALKPHOS 116 04/11/2022   BILITOT 0.4 04/11/2022   GFRNONAA >60 04/28/2022   GFRAA >60 03/23/2016    Lab Results  Component Value Date   WBC 10.0 04/28/2022   NEUTROABS 6.7 03/10/2021   HGB 10.7 (L) 04/28/2022   HCT 36.9 04/28/2022   MCV 78.8 (L) 04/28/2022   PLT 374 04/28/2022    Lab Results  Component Value Date   FERRITIN 18 11/20/2015     STUDIES: No results found.  ASSESSMENT AND PLAN:   Courtney Simpson is a 47 y.o. female with pmh of anxiety, GERD, hyperlipidemia, hypertension, sarcoidosis previously on methotrexate referred to hematology for iron deficiency anemia not responding to oral iron.  # Iron deficiency anemia # History of gastric bypass surgery in 2017 -Present at least since August 2024.  On Slow Fe with no improvement in lab work.  Differentials  include malabsorption from gastric bypass surgery versus occult GI bleed. -Recent labs reviewed.  Hemoglobin 9.6 and ferritin of 5.  Discussed about IV Venofer 200 mg weekly x 5 doses.  Occasional side effects such as nausea, chest pain and back pain and allergic reaction was discussed. -She was seen by Dr. Kathryne Simpson, GI.  Scheduled for colonoscopy and endoscopy after Christmas. -Has occasional vaginal spotting.  Denies any vaginal bleeding.  Has Mirena IUD in place.\ -Vitamin B12 312  # Syncopal episodes - Patient having frequent syncopal episodes in the past 1 year. Seen by Dr. Melton Alar from cardiology.  Had Holter monitor which showed short run of VT.  Echo was normal.  There was a concern it may be related to OSA.  She is waiting to get sleep study.  Orders Placed This Encounter  Procedures   Iron and TIBC([facility])   Ferritin   CBC with Differential (Cancer Center Only)   CMP (Cancer Center only)   Vitamin B12   Reticulocytes   Iron and TIBC([facility])   Schedule for IV Venofer 200 mg weekly x 5 doses RTC in 3 months for MD visit, labs, possible Venofer  Patient expressed understanding and was in agreement with this plan. She also understands that She can call clinic at any time with any questions, concerns, or complaints.   I spent a total of 45 minutes reviewing chart data, face-to-face evaluation with the patient, counseling and coordination of care as detailed above.  Michaelyn Barter, MD   09/19/2023 11:14 AM

## 2023-09-23 DIAGNOSIS — E785 Hyperlipidemia, unspecified: Secondary | ICD-10-CM | POA: Diagnosis not present

## 2023-09-23 DIAGNOSIS — D509 Iron deficiency anemia, unspecified: Secondary | ICD-10-CM | POA: Diagnosis not present

## 2023-09-23 DIAGNOSIS — R55 Syncope and collapse: Secondary | ICD-10-CM | POA: Diagnosis not present

## 2023-09-23 DIAGNOSIS — K219 Gastro-esophageal reflux disease without esophagitis: Secondary | ICD-10-CM | POA: Diagnosis not present

## 2023-09-23 DIAGNOSIS — I10 Essential (primary) hypertension: Secondary | ICD-10-CM | POA: Diagnosis not present

## 2023-09-23 DIAGNOSIS — D869 Sarcoidosis, unspecified: Secondary | ICD-10-CM | POA: Diagnosis not present

## 2023-09-23 DIAGNOSIS — F419 Anxiety disorder, unspecified: Secondary | ICD-10-CM | POA: Diagnosis not present

## 2023-09-26 NOTE — Progress Notes (Signed)
Work excuse, Venofer after effects per Dr. Mervyn Skeeters for 1 day

## 2023-09-28 DIAGNOSIS — I472 Ventricular tachycardia, unspecified: Secondary | ICD-10-CM | POA: Diagnosis not present

## 2023-09-28 DIAGNOSIS — E785 Hyperlipidemia, unspecified: Secondary | ICD-10-CM | POA: Diagnosis not present

## 2023-09-28 DIAGNOSIS — D8683 Sarcoid iridocyclitis: Secondary | ICD-10-CM | POA: Diagnosis not present

## 2023-09-28 DIAGNOSIS — F419 Anxiety disorder, unspecified: Secondary | ICD-10-CM | POA: Diagnosis not present

## 2023-09-28 DIAGNOSIS — I10 Essential (primary) hypertension: Secondary | ICD-10-CM | POA: Diagnosis not present

## 2023-09-28 DIAGNOSIS — K219 Gastro-esophageal reflux disease without esophagitis: Secondary | ICD-10-CM | POA: Diagnosis not present

## 2023-09-28 DIAGNOSIS — D869 Sarcoidosis, unspecified: Secondary | ICD-10-CM | POA: Diagnosis not present

## 2023-09-28 DIAGNOSIS — D509 Iron deficiency anemia, unspecified: Secondary | ICD-10-CM

## 2023-09-28 DIAGNOSIS — R55 Syncope and collapse: Secondary | ICD-10-CM | POA: Diagnosis not present

## 2023-09-28 DIAGNOSIS — G4733 Obstructive sleep apnea (adult) (pediatric): Secondary | ICD-10-CM | POA: Diagnosis not present

## 2023-09-28 MED ORDER — SODIUM CHLORIDE 0.9% FLUSH
10.0000 mL | Freq: Once | INTRAVENOUS | Status: DC | PRN
Start: 2023-09-28 — End: 2023-09-28

## 2023-09-28 NOTE — Patient Instructions (Signed)
Iron Sucrose Injection What is this medication? IRON SUCROSE (EYE ern SOO krose) treats low levels of iron (iron deficiency anemia) in people with kidney disease. Iron is a mineral that plays an important role in making red blood cells, which carry oxygen from your lungs to the rest of your body. This medicine may be used for other purposes; ask your health care provider or pharmacist if you have questions. COMMON BRAND NAME(S): Venofer What should I tell my care team before I take this medication? They need to know if you have any of these conditions: Anemia not caused by low iron levels Heart disease High levels of iron in the blood Kidney disease Liver disease An unusual or allergic reaction to iron, other medications, foods, dyes, or preservatives Pregnant or trying to get pregnant Breastfeeding How should I use this medication? This medication is for infusion into a vein. It is given in a hospital or clinic setting. Talk to your care team about the use of this medication in children. While this medication may be prescribed for children as young as 2 years for selected conditions, precautions do apply. Overdosage: If you think you have taken too much of this medicine contact a poison control center or emergency room at once. NOTE: This medicine is only for you. Do not share this medicine with others. What if I miss a dose? Keep appointments for follow-up doses. It is important not to miss your dose. Call your care team if you are unable to keep an appointment. What may interact with this medication? Do not take this medication with any of the following: Deferoxamine Dimercaprol Other iron products This medication may also interact with the following: Chloramphenicol Deferasirox This list may not describe all possible interactions. Give your health care provider a list of all the medicines, herbs, non-prescription drugs, or dietary supplements you use. Also tell them if you smoke,  drink alcohol, or use illegal drugs. Some items may interact with your medicine. What should I watch for while using this medication? Visit your care team regularly. Tell your care team if your symptoms do not start to get better or if they get worse. You may need blood work done while you are taking this medication. You may need to follow a special diet. Talk to your care team. Foods that contain iron include: whole grains/cereals, dried fruits, beans, or peas, leafy green vegetables, and organ meats (liver, kidney). What side effects may I notice from receiving this medication? Side effects that you should report to your care team as soon as possible: Allergic reactions--skin rash, itching, hives, swelling of the face, lips, tongue, or throat Low blood pressure--dizziness, feeling faint or lightheaded, blurry vision Shortness of breath Side effects that usually do not require medical attention (report to your care team if they continue or are bothersome): Flushing Headache Joint pain Muscle pain Nausea Pain, redness, or irritation at injection site This list may not describe all possible side effects. Call your doctor for medical advice about side effects. You may report side effects to FDA at 1-800-FDA-1088. Where should I keep my medication? This medication is given in a hospital or clinic. It will not be stored at home. NOTE: This sheet is a summary. It may not cover all possible information. If you have questions about this medicine, talk to your doctor, pharmacist, or health care provider.  2024 Elsevier/Gold Standard (2023-04-08 00:00:00)

## 2023-09-28 NOTE — Progress Notes (Signed)
Pt reports that she has felt worse since receiving the Venofer and still experiencing "fainting spells' with the last one being at 3am this morning ( she was having fainting spells prior to 1st Venofer infusion per pt). Dr. Alena Bills made aware. And okay to proceed with Venofer at this time, pt is seeing cardiology this morning per pt.

## 2023-10-03 DIAGNOSIS — G629 Polyneuropathy, unspecified: Secondary | ICD-10-CM | POA: Diagnosis not present

## 2023-10-03 DIAGNOSIS — I700 Atherosclerosis of aorta: Secondary | ICD-10-CM | POA: Diagnosis not present

## 2023-10-03 DIAGNOSIS — F3340 Major depressive disorder, recurrent, in remission, unspecified: Secondary | ICD-10-CM | POA: Diagnosis not present

## 2023-10-03 DIAGNOSIS — E7849 Other hyperlipidemia: Secondary | ICD-10-CM | POA: Diagnosis not present

## 2023-10-03 DIAGNOSIS — K2100 Gastro-esophageal reflux disease with esophagitis, without bleeding: Secondary | ICD-10-CM | POA: Diagnosis not present

## 2023-10-03 DIAGNOSIS — R7303 Prediabetes: Secondary | ICD-10-CM | POA: Diagnosis not present

## 2023-10-03 DIAGNOSIS — G4733 Obstructive sleep apnea (adult) (pediatric): Secondary | ICD-10-CM | POA: Diagnosis not present

## 2023-10-03 DIAGNOSIS — Z0000 Encounter for general adult medical examination without abnormal findings: Secondary | ICD-10-CM | POA: Diagnosis not present

## 2023-10-03 DIAGNOSIS — D860 Sarcoidosis of lung: Secondary | ICD-10-CM | POA: Diagnosis not present

## 2023-10-05 DIAGNOSIS — D509 Iron deficiency anemia, unspecified: Secondary | ICD-10-CM

## 2023-10-05 DIAGNOSIS — R55 Syncope and collapse: Secondary | ICD-10-CM | POA: Diagnosis not present

## 2023-10-05 DIAGNOSIS — F419 Anxiety disorder, unspecified: Secondary | ICD-10-CM | POA: Diagnosis not present

## 2023-10-05 DIAGNOSIS — K219 Gastro-esophageal reflux disease without esophagitis: Secondary | ICD-10-CM | POA: Diagnosis not present

## 2023-10-05 DIAGNOSIS — D869 Sarcoidosis, unspecified: Secondary | ICD-10-CM | POA: Diagnosis not present

## 2023-10-05 DIAGNOSIS — E785 Hyperlipidemia, unspecified: Secondary | ICD-10-CM | POA: Diagnosis not present

## 2023-10-05 DIAGNOSIS — I10 Essential (primary) hypertension: Secondary | ICD-10-CM | POA: Diagnosis not present

## 2023-10-11 DIAGNOSIS — K219 Gastro-esophageal reflux disease without esophagitis: Secondary | ICD-10-CM | POA: Diagnosis not present

## 2023-10-11 DIAGNOSIS — D509 Iron deficiency anemia, unspecified: Secondary | ICD-10-CM | POA: Diagnosis not present

## 2023-10-11 DIAGNOSIS — I10 Essential (primary) hypertension: Secondary | ICD-10-CM | POA: Diagnosis not present

## 2023-10-11 DIAGNOSIS — E785 Hyperlipidemia, unspecified: Secondary | ICD-10-CM | POA: Diagnosis not present

## 2023-10-11 DIAGNOSIS — D869 Sarcoidosis, unspecified: Secondary | ICD-10-CM | POA: Diagnosis not present

## 2023-10-11 DIAGNOSIS — F419 Anxiety disorder, unspecified: Secondary | ICD-10-CM | POA: Diagnosis not present

## 2023-10-11 DIAGNOSIS — R55 Syncope and collapse: Secondary | ICD-10-CM | POA: Diagnosis not present

## 2023-10-11 NOTE — Progress Notes (Signed)
Declined 30 minute post-observation. Aware of risks. Vitals stable at discharge.

## 2023-10-11 NOTE — Patient Instructions (Signed)
Iron Sucrose Injection What is this medication? IRON SUCROSE (EYE ern SOO krose) treats low levels of iron (iron deficiency anemia) in people with kidney disease. Iron is a mineral that plays an important role in making red blood cells, which carry oxygen from your lungs to the rest of your body. This medicine may be used for other purposes; ask your health care provider or pharmacist if you have questions. COMMON BRAND NAME(S): Venofer What should I tell my care team before I take this medication? They need to know if you have any of these conditions: Anemia not caused by low iron levels Heart disease High levels of iron in the blood Kidney disease Liver disease An unusual or allergic reaction to iron, other medications, foods, dyes, or preservatives Pregnant or trying to get pregnant Breastfeeding How should I use this medication? This medication is for infusion into a vein. It is given in a hospital or clinic setting. Talk to your care team about the use of this medication in children. While this medication may be prescribed for children as young as 2 years for selected conditions, precautions do apply. Overdosage: If you think you have taken too much of this medicine contact a poison control center or emergency room at once. NOTE: This medicine is only for you. Do not share this medicine with others. What if I miss a dose? Keep appointments for follow-up doses. It is important not to miss your dose. Call your care team if you are unable to keep an appointment. What may interact with this medication? Do not take this medication with any of the following: Deferoxamine Dimercaprol Other iron products This medication may also interact with the following: Chloramphenicol Deferasirox This list may not describe all possible interactions. Give your health care provider a list of all the medicines, herbs, non-prescription drugs, or dietary supplements you use. Also tell them if you smoke,  drink alcohol, or use illegal drugs. Some items may interact with your medicine. What should I watch for while using this medication? Visit your care team regularly. Tell your care team if your symptoms do not start to get better or if they get worse. You may need blood work done while you are taking this medication. You may need to follow a special diet. Talk to your care team. Foods that contain iron include: whole grains/cereals, dried fruits, beans, or peas, leafy green vegetables, and organ meats (liver, kidney). What side effects may I notice from receiving this medication? Side effects that you should report to your care team as soon as possible: Allergic reactions--skin rash, itching, hives, swelling of the face, lips, tongue, or throat Low blood pressure--dizziness, feeling faint or lightheaded, blurry vision Shortness of breath Side effects that usually do not require medical attention (report to your care team if they continue or are bothersome): Flushing Headache Joint pain Muscle pain Nausea Pain, redness, or irritation at injection site This list may not describe all possible side effects. Call your doctor for medical advice about side effects. You may report side effects to FDA at 1-800-FDA-1088. Where should I keep my medication? This medication is given in a hospital or clinic. It will not be stored at home. NOTE: This sheet is a summary. It may not cover all possible information. If you have questions about this medicine, talk to your doctor, pharmacist, or health care provider.  2024 Elsevier/Gold Standard (2023-04-08 00:00:00)

## 2023-10-19 DIAGNOSIS — D509 Iron deficiency anemia, unspecified: Secondary | ICD-10-CM | POA: Insufficient documentation

## 2023-10-19 NOTE — Progress Notes (Signed)
10/19/23 pt here today for iron infusion, she was dehydrated due to poor po intake of water. She was stuck 4 times before IV could be established. Discussed need for increased po intake of fluids, esp. Water prior to appointments. Pt called back this afternoon with questions and I re-instructed her how important it is to increase po intake of fluid prior to infusions. Pt verbalizes u/o and instructed to call with any further questions.

## 2023-10-20 DIAGNOSIS — R002 Palpitations: Secondary | ICD-10-CM | POA: Diagnosis not present

## 2023-10-24 DIAGNOSIS — R002 Palpitations: Secondary | ICD-10-CM | POA: Diagnosis not present

## 2023-10-24 DIAGNOSIS — I10 Essential (primary) hypertension: Secondary | ICD-10-CM | POA: Diagnosis not present

## 2023-10-24 DIAGNOSIS — R55 Syncope and collapse: Secondary | ICD-10-CM | POA: Diagnosis not present

## 2023-10-24 DIAGNOSIS — G4733 Obstructive sleep apnea (adult) (pediatric): Secondary | ICD-10-CM | POA: Diagnosis not present

## 2023-11-04 DIAGNOSIS — R55 Syncope and collapse: Secondary | ICD-10-CM | POA: Diagnosis not present

## 2023-11-04 DIAGNOSIS — I472 Ventricular tachycardia, unspecified: Secondary | ICD-10-CM | POA: Diagnosis not present

## 2023-11-11 DIAGNOSIS — F32A Depression, unspecified: Secondary | ICD-10-CM | POA: Insufficient documentation

## 2023-11-11 DIAGNOSIS — K219 Gastro-esophageal reflux disease without esophagitis: Secondary | ICD-10-CM | POA: Insufficient documentation

## 2023-11-11 DIAGNOSIS — G4733 Obstructive sleep apnea (adult) (pediatric): Secondary | ICD-10-CM | POA: Diagnosis not present

## 2023-11-11 DIAGNOSIS — K5730 Diverticulosis of large intestine without perforation or abscess without bleeding: Secondary | ICD-10-CM | POA: Insufficient documentation

## 2023-11-11 DIAGNOSIS — F419 Anxiety disorder, unspecified: Secondary | ICD-10-CM | POA: Diagnosis not present

## 2023-11-11 DIAGNOSIS — I10 Essential (primary) hypertension: Secondary | ICD-10-CM | POA: Insufficient documentation

## 2023-11-11 DIAGNOSIS — Z9049 Acquired absence of other specified parts of digestive tract: Secondary | ICD-10-CM | POA: Insufficient documentation

## 2023-11-11 DIAGNOSIS — Z980 Intestinal bypass and anastomosis status: Secondary | ICD-10-CM | POA: Insufficient documentation

## 2023-11-11 DIAGNOSIS — K640 First degree hemorrhoids: Secondary | ICD-10-CM | POA: Diagnosis not present

## 2023-11-11 DIAGNOSIS — D509 Iron deficiency anemia, unspecified: Secondary | ICD-10-CM | POA: Insufficient documentation

## 2023-11-11 DIAGNOSIS — Z79899 Other long term (current) drug therapy: Secondary | ICD-10-CM | POA: Diagnosis not present

## 2023-11-11 DIAGNOSIS — D869 Sarcoidosis, unspecified: Secondary | ICD-10-CM | POA: Insufficient documentation

## 2023-11-11 DIAGNOSIS — K5790 Diverticulosis of intestine, part unspecified, without perforation or abscess without bleeding: Secondary | ICD-10-CM | POA: Diagnosis not present

## 2023-11-11 DIAGNOSIS — Z9884 Bariatric surgery status: Secondary | ICD-10-CM | POA: Diagnosis not present

## 2023-11-11 DIAGNOSIS — K649 Unspecified hemorrhoids: Secondary | ICD-10-CM | POA: Diagnosis not present

## 2023-11-11 HISTORY — PX: COLONOSCOPY WITH PROPOFOL: SHX5780

## 2023-11-11 HISTORY — DX: Sleep apnea, unspecified: G47.30

## 2023-11-11 HISTORY — PX: ESOPHAGOGASTRODUODENOSCOPY (EGD) WITH PROPOFOL: SHX5813

## 2023-11-11 MED ORDER — PROPOFOL 1000 MG/100ML IV EMUL
INTRAVENOUS | Status: AC
Start: 2023-11-11 — End: ?

## 2023-11-11 MED ORDER — EPHEDRINE 5 MG/ML INJ
INTRAVENOUS | Status: AC
Start: 2023-11-11 — End: ?

## 2023-11-11 NOTE — Op Note (Signed)
[facility] Gastroenterology Patient Name: Courtney Simpson Procedure Date: 11/11/2023 12:32 PM MRN: [id number] Account #: [id number] Date of Birth: 1976/07/01 Admit Type: Outpatient Age: 47 Room: [facility] ENDO ROOM 3 Gender: Female Note Status: Finalized Instrument Name: Prentice Docker [id number] Procedure:             Colonoscopy Indications:           Iron deficiency anemia Providers:             Eather Colas MD, MD Referring MD:          Daniel Nones, MD (Referring MD) Medicines:             Monitored Anesthesia Care Complications:         No immediate complications. Procedure:             Pre-Anesthesia Assessment:                        - Prior to the procedure, a History and Physical was                         performed, and patient medications and allergies were                         reviewed. The patient is competent. The risks and                         benefits of the procedure and the sedation options and                         risks were discussed with the patient. All questions                         were answered and informed consent was obtained.                         Patient identification and proposed procedure were                         verified by the physician, the nurse, the                         anesthesiologist, the anesthetist and the technician                         in the endoscopy suite. Mental Status Examination:                         alert and oriented. Airway Examination: normal                         oropharyngeal airway and neck mobility. Respiratory                         Examination: clear to auscultation. CV Examination:                         normal. Prophylactic Antibiotics: The patient does not  require prophylactic antibiotics. Prior                         Anticoagulants: The patient has taken no anticoagulant                         or antiplatelet agents. ASA Grade Assessment: III - A                          patient with severe systemic disease. After reviewing                         the risks and benefits, the patient was deemed in                         satisfactory condition to undergo the procedure. The                         anesthesia plan was to use monitored anesthesia care                         (MAC). Immediately prior to administration of                         medications, the patient was re-assessed for adequacy                         to receive sedatives. The heart rate, respiratory                         rate, oxygen saturations, blood pressure, adequacy of                         pulmonary ventilation, and response to care were                         monitored throughout the procedure. The physical                         status of the patient was re-assessed after the                         procedure.                        After obtaining informed consent, the colonoscope was                         passed under direct vision. Throughout the procedure,                         the patient's blood pressure, pulse, and oxygen                         saturations were monitored continuously. The                         Colonoscope was introduced through the anus and  advanced to the the terminal ileum, with                         identification of the appendiceal orifice and IC                         valve. The colonoscopy was performed without                         difficulty. The patient tolerated the procedure well.                         The quality of the bowel preparation was good. The                         terminal ileum, ileocecal valve, appendiceal orifice,                         and rectum were photographed. Findings:      The perianal and digital rectal examinations were normal.      The terminal ileum appeared normal.      A few small-mouthed diverticula were found in the sigmoid colon.      Internal  hemorrhoids were found during retroflexion. The hemorrhoids       were Grade I (internal hemorrhoids that do not prolapse).      The exam was otherwise without abnormality on direct and retroflexion       views. Impression:            - The examined portion of the ileum was normal.                        - Diverticulosis in the sigmoid colon.                        - Internal hemorrhoids.                        - The examination was otherwise normal on direct and                         retroflexion views.                        - No specimens collected. Recommendation:        - Discharge patient to home.                        - Resume previous diet.                        - Continue present medications.                        - Repeat colonoscopy in 10 years for screening                         purposes.                        - Return to referring physician as previously  scheduled. Procedure Code(s):     --- Professional ---                        [phone], Colonoscopy, flexible; diagnostic, including                         collection of specimen(s) by brushing or washing, when                         performed (separate procedure) Diagnosis Code(s):     --- Professional ---                        K64.0, First degree hemorrhoids                        D50.9, Iron deficiency anemia, unspecified                        K57.30, Diverticulosis of large intestine without                         perforation or abscess without bleeding CPT copyright 2022 American [employer] Association. All rights reserved. The codes documented in this report are preliminary and upon coder review may  be revised to meet current compliance requirements. Eather Colas MD, MD 11/11/2023 1:15:33 PM Number of Addenda: 0 Note Initiated On: 11/11/2023 12:32 PM Scope Withdrawal Time: 0 hours 6 minutes 47 seconds  Total Procedure Duration: 0 hours 13 minutes 36 seconds  Estimated Blood  Loss:  Estimated blood loss: none.      [facility]

## 2023-11-11 NOTE — Op Note (Signed)
[facility] Gastroenterology Patient Name: Courtney Simpson Procedure Date: 11/11/2023 12:33 PM MRN: [id number] Account #: [id number] Date of Birth: 1976/10/09 Admit Type: Outpatient Age: 47 Room: [facility] ENDO ROOM 3 Gender: Female Note Status: Finalized Instrument Name: Patton Salles Endoscope [id number] Procedure:             Upper GI endoscopy Indications:           Iron deficiency anemia Providers:             Eather Colas MD, MD Referring MD:          Daniel Nones, MD (Referring MD) Medicines:             Monitored Anesthesia Care Complications:         No immediate complications. Procedure:             Pre-Anesthesia Assessment:                        - Prior to the procedure, a History and Physical was                         performed, and patient medications and allergies were                         reviewed. The patient is competent. The risks and                         benefits of the procedure and the sedation options and                         risks were discussed with the patient. All questions                         were answered and informed consent was obtained.                         Patient identification and proposed procedure were                         verified by the physician, the nurse, the                         anesthesiologist, the anesthetist and the technician                         in the endoscopy suite. Mental Status Examination:                         alert and oriented. Airway Examination: normal                         oropharyngeal airway and neck mobility. Respiratory                         Examination: clear to auscultation. CV Examination:                         normal. Prophylactic Antibiotics: The patient does not  require prophylactic antibiotics. Prior                         Anticoagulants: The patient has taken no anticoagulant                         or antiplatelet agents. ASA Grade Assessment:  III - A                         patient with severe systemic disease. After reviewing                         the risks and benefits, the patient was deemed in                         satisfactory condition to undergo the procedure. The                         anesthesia plan was to use monitored anesthesia care                         (MAC). Immediately prior to administration of                         medications, the patient was re-assessed for adequacy                         to receive sedatives. The heart rate, respiratory                         rate, oxygen saturations, blood pressure, adequacy of                         pulmonary ventilation, and response to care were                         monitored throughout the procedure. The physical                         status of the patient was re-assessed after the                         procedure.                        After obtaining informed consent, the endoscope was                         passed under direct vision. Throughout the procedure,                         the patient's blood pressure, pulse, and oxygen                         saturations were monitored continuously. The Endoscope                         was introduced through the mouth, and advanced to the  second part of duodenum. The upper GI endoscopy was                         accomplished without difficulty. The patient tolerated                         the procedure well. Findings:      The examined esophagus was normal.      Evidence of a Roux-en-Y gastrojejunostomy was found. The gastrojejunal       anastomosis was characterized by healthy appearing mucosa. This was       traversed. The pouch-to-jejunum limb was characterized by healthy       appearing mucosa. The jejunojejunal anastomosis was characterized by       healthy appearing mucosa.      The examined jejunum was normal. Impression:            - Normal esophagus.                         - Roux-en-Y gastrojejunostomy with gastrojejunal                         anastomosis characterized by healthy appearing mucosa.                        - Normal examined jejunum.                        - No specimens collected. Recommendation:        - Discharge patient to home.                        - Resume previous diet.                        - Continue present medications.                        - Return to referring physician as previously                         scheduled. Procedure Code(s):     --- Professional ---                        [phone], Esophagogastroduodenoscopy, flexible,                         transoral; diagnostic, including collection of                         specimen(s) by brushing or washing, when performed                         (separate procedure) Diagnosis Code(s):     --- Professional ---                        Z98.0, Intestinal bypass and anastomosis status                        D50.9, Iron deficiency anemia, unspecified CPT copyright 2022 American Medical Association. All rights reserved. The  codes documented in this report are preliminary and upon coder review may  be revised to meet current compliance requirements. Eather Colas MD, MD 11/11/2023 1:10:02 PM Number of Addenda: 0 Note Initiated On: 11/11/2023 12:33 PM Estimated Blood Loss:  Estimated blood loss: none.      [facility]

## 2023-11-11 NOTE — H&P (Signed)
Outpatient short stay form Pre-procedure 11/11/2023  Regis Bill, MD  Primary Physician: Lynnea Ferrier, MD  Reason for visit:  IDA  History of present illness:    47 y/o lady with history of obesity, hypertension, sarcoid, and OSA here for EGD/Colonoscopy for IDA. Last EGD/Colonoscopy in 2017 that was unremarkable. No blood thinners. No family history of GI malignancies. History of gastric bypass and cholecystectomy.    Current Facility-Administered Medications:    0.9 %  sodium chloride infusion, , Intravenous, Continuous, Charae Depaolis, Rossie Muskrat, MD, Last Rate: 40 mL/hr at 11/11/23 1157, New Bag at 11/11/23 1157  Medications Prior to Admission  Medication Sig Dispense Refill Last Dose/Taking   citalopram (CELEXA) 40 MG tablet Take 40 mg by mouth daily.   11/10/2023   pantoprazole (PROTONIX) 40 MG tablet Take 40 mg by mouth daily.   11/10/2023   ARIPiprazole (ABILIFY) 5 MG tablet Take 5 mg by mouth daily. (Patient not taking: Reported on 04/11/2022)      cetirizine (ZYRTEC ALLERGY) 10 MG tablet Take 1 tablet (10 mg total) by mouth daily. 30 tablet 0    cyanocobalamin (,VITAMIN B-12,) 1000 MCG/ML injection INJECT 1 ML (1000 MCG) INTO THE MUSCLE MONTHLY (Patient not taking: Reported on 04/11/2022)      famotidine (PEPCID) 40 MG tablet Take 1 tablet (40 mg total) by mouth at bedtime. (Patient not taking: Reported on 09/19/2023) 30 tablet 0    Flibanserin (ADDYI) 100 MG TABS Take 100 mg by mouth at bedtime. (Patient not taking: Reported on 04/11/2022) 30 tablet 2    FLUoxetine (PROZAC) 40 MG capsule Take 40 mg by mouth daily. (Patient not taking: Reported on 11/24/2021)      folic acid (FOLVITE) 1 MG tablet Take 1 mg by mouth daily.      hydrOXYzine (VISTARIL) 25 MG capsule Take 25 mg by mouth 2 (two) times daily.      ibuprofen (ADVIL) 800 MG tablet Take 800 mg by mouth every 6 (six) hours as needed. (Patient not taking: Reported on 09/19/2023)      levonorgestrel (MIRENA) 20 MCG/24HR  IUD 1 each by Intrauterine route once.      losartan (COZAAR) 100 MG tablet Take 100 mg by mouth daily. (Patient not taking: Reported on 09/19/2023)      losartan (COZAAR) 50 MG tablet Take 50 mg by mouth daily. (Patient not taking: Reported on 04/11/2022)      methotrexate (RHEUMATREX) 2.5 MG tablet Take 10 mg by mouth once a week. Caution:Chemotherapy. Protect from light. O n sat (Patient not taking: Reported on 09/19/2023)      methotrexate 250 MG/10ML injection Inject 25 mg into the skin once a week. Thursday (Patient not taking: Reported on 04/11/2022)      prochlorperazine (COMPAZINE) 5 MG tablet Take 1 tablet (5 mg total) by mouth every 8 (eight) hours as needed (headache). 20 tablet 0    REXULTI 1 MG TABS tablet Take 1 mg by mouth daily. (Patient not taking: Reported on 09/19/2023)      rOPINIRole (REQUIP) 0.25 MG tablet Take 0.25 mg by mouth 2 (two) times daily. (Patient not taking: Reported on 04/11/2022)      sertraline (ZOLOFT) 100 MG tablet Take 100 mg by mouth 2 (two) times daily. (Patient not taking: Reported on 11/24/2021)      topiramate (TOPAMAX) 100 MG tablet Pt reported not taking. (Patient not taking: Reported on 11/24/2021)      traMADol (ULTRAM) 50 MG tablet Take 50 mg by  mouth every 6 (six) hours as needed. (Patient not taking: Reported on 04/11/2022)      venlafaxine XR (EFFEXOR-XR) 150 MG 24 hr capsule Take 150 mg by mouth daily. (Patient not taking: Reported on 11/24/2021)        Allergies  Allergen Reactions   Egg-Derived Products Rash    Egg whites   Other Anaphylaxis    Nuts   Misc. Sulfonamide Containing Compounds    Sulfamethoxazole    Trimethoprim    Adhesive [Tape] Dermatitis    Blisters   Septra [Sulfamethoxazole-Trimethoprim] Rash   Sulfa Antibiotics Rash   Vancomycin Rash     Past Medical History:  Diagnosis Date   Anxiety    Arthritis    Depression    Fatty liver    GERD (gastroesophageal reflux disease)    Hyperlipidemia    Hypertension     Sarcoidosis    Sleep apnea     Review of systems:  Otherwise negative.    Physical Exam  Gen: Alert, oriented. Appears stated age.  HEENT: PERRLA. Lungs: No respiratory distress Abd: soft, benign, no masses Ext: No edema    Planned procedures: Proceed with EGD/colonoscopy. The patient understands the nature of the planned procedure, indications, risks, alternatives and potential complications including but not limited to bleeding, infection, perforation, damage to internal organs and possible oversedation/side effects from anesthesia. The patient agrees and gives consent to proceed.  Please refer to procedure notes for findings, recommendations and patient disposition/instructions.     Regis Bill, MD [facility]

## 2023-11-11 NOTE — Interval H&P Note (Signed)
History and Physical Interval Note:  11/11/2023 12:37 PM  Courtney Simpson  has presented today for surgery, with the diagnosis of IDA,.  The various methods of treatment have been discussed with the patient and family. After consideration of risks, benefits and other options for treatment, the patient has consented to  Procedure(s): COLONOSCOPY WITH PROPOFOL (N/A) ESOPHAGOGASTRODUODENOSCOPY (EGD) WITH PROPOFOL (N/A) as a surgical intervention.  The patient's history has been reviewed, patient examined, no change in status, stable for surgery.  I have reviewed the patient's chart and labs.  Questions were answered to the patient's satisfaction.     Regis Bill  Ok to proceed with EGD/Colonoscopy

## 2023-11-11 NOTE — Anesthesia Preprocedure Evaluation (Signed)
Anesthesia Evaluation  Patient identified by MRN, date of birth, ID band Patient awake    Reviewed: Allergy & Precautions, NPO status , Patient's Chart, lab work & pertinent test results  Airway Mallampati: II  TM Distance: >3 FB     Dental  (+) Teeth Intact   Pulmonary sleep apnea     + decreased breath sounds      Cardiovascular Exercise Tolerance: Good hypertension, Pt. on medications  Rhythm:Regular Rate:Normal     Neuro/Psych  Headaches   Depression    TIA   GI/Hepatic Neg liver ROS,GERD  Medicated,,  Endo/Other    Class 3 obesity  Renal/GU negative Renal ROS  negative genitourinary   Musculoskeletal  (+) Arthritis ,    Abdominal  (+) + obese  Peds negative pediatric ROS (+)  Hematology  (+) Blood dyscrasia, anemia   Anesthesia Other Findings   Reproductive/Obstetrics                             Anesthesia Physical Anesthesia Plan  ASA: 3  Anesthesia Plan: General   Post-op Pain Management:    Induction: Intravenous  PONV Risk Score and Plan:   Airway Management Planned: Natural Airway and Nasal Cannula  Additional Equipment:   Intra-op Plan:   Post-operative Plan:   Informed Consent: I have reviewed the patients History and Physical, chart, labs and discussed the procedure including the risks, benefits and alternatives for the proposed anesthesia with the patient or authorized representative who has indicated his/her understanding and acceptance.     Dental Advisory Given  Plan Discussed with: CRNA  Anesthesia Plan Comments: (Patient consented for risks of anesthesia including but not limited to:  - adverse reactions to medications - risk of airway placement if required - damage to eyes, teeth, lips or other oral mucosa - nerve damage due to positioning  - sore throat or hoarseness - Damage to heart, brain, nerves, lungs, other parts of body or loss of  life  Patient voiced understanding and assent.)       Anesthesia Quick Evaluation

## 2023-11-11 NOTE — Anesthesia Postprocedure Evaluation (Signed)
Anesthesia Post Note  Patient: Courtney Simpson  Procedure(s) Performed: COLONOSCOPY WITH PROPOFOL ESOPHAGOGASTRODUODENOSCOPY (EGD) WITH PROPOFOL  Patient location during evaluation: PACU Anesthesia Type: General Level of consciousness: awake and alert Pain management: pain level controlled Vital Signs Assessment: post-procedure vital signs reviewed and stable Respiratory status: spontaneous breathing Cardiovascular status: stable Anesthetic complications: no   No notable events documented.   Last Vitals:  Vitals:   11/11/23 1148 11/11/23 1309  BP: 134/81 106/68  Pulse: 64 79  Resp: 20 12  Temp: 36.9 C (!) 36.3 C  SpO2: 97% 100%    Last Pain:  Vitals:   11/11/23 1309  TempSrc: Temporal  PainSc: 0-No pain                 VAN STAVEREN,Jalan Fariss

## 2023-11-11 NOTE — Transfer of Care (Signed)
Immediate Anesthesia Transfer of Care Note  Patient: Courtney Simpson  Procedure(s) Performed: COLONOSCOPY WITH PROPOFOL ESOPHAGOGASTRODUODENOSCOPY (EGD) WITH PROPOFOL  Patient Location: PACU  Anesthesia Type:General  Level of Consciousness: drowsy  Airway & Oxygen Therapy: Patient Spontanous Breathing  Post-op Assessment: Report given to RN and Post -op Vital signs reviewed and stable  Post vital signs: Reviewed and stable  Last Vitals:  Vitals Value Taken Time  BP 106/68 11/11/23 1309  Temp 36.3 C 11/11/23 1309  Pulse 76 11/11/23 1312  Resp 16 11/11/23 1312  SpO2 100 % 11/11/23 1312  Vitals shown include unfiled device data.  Last Pain:  Vitals:   11/11/23 1309  TempSrc: Temporal  PainSc: 0-No pain         Complications: No notable events documented.

## 2023-11-18 DIAGNOSIS — M79602 Pain in left arm: Secondary | ICD-10-CM | POA: Diagnosis not present

## 2023-11-18 DIAGNOSIS — M25512 Pain in left shoulder: Secondary | ICD-10-CM | POA: Diagnosis not present

## 2023-11-18 DIAGNOSIS — M542 Cervicalgia: Secondary | ICD-10-CM | POA: Diagnosis not present

## 2023-11-19 DIAGNOSIS — M25512 Pain in left shoulder: Secondary | ICD-10-CM | POA: Diagnosis not present

## 2023-11-19 DIAGNOSIS — I10 Essential (primary) hypertension: Secondary | ICD-10-CM | POA: Diagnosis not present

## 2023-11-20 DIAGNOSIS — M25512 Pain in left shoulder: Secondary | ICD-10-CM | POA: Diagnosis not present

## 2023-11-20 MED ORDER — ONDANSETRON 4 MG PO TBDP
4.0000 mg | ORAL_TABLET | Freq: Four times a day (QID) | ORAL | 0 refills | Status: DC | PRN
Start: 2023-11-20 — End: 2023-11-20

## 2023-11-20 NOTE — ED Triage Notes (Signed)
 Pt report left shoulder pain that radiates down the arm and into the hand. Described it as sharp pain. No known injury. No CP/SOB. Has taken tylenol/ibuprofen and heat without relief. Recently on cardiac monitor awaiting permanent monitor.

## 2023-11-23 DIAGNOSIS — M5030 Other cervical disc degeneration, unspecified cervical region: Secondary | ICD-10-CM | POA: Diagnosis not present

## 2023-11-23 DIAGNOSIS — I700 Atherosclerosis of aorta: Secondary | ICD-10-CM | POA: Diagnosis not present

## 2023-11-23 DIAGNOSIS — I472 Ventricular tachycardia, unspecified: Secondary | ICD-10-CM | POA: Diagnosis not present

## 2023-11-23 DIAGNOSIS — M25512 Pain in left shoulder: Secondary | ICD-10-CM | POA: Diagnosis not present

## 2023-11-23 DIAGNOSIS — D860 Sarcoidosis of lung: Secondary | ICD-10-CM | POA: Diagnosis not present

## 2023-12-20 NOTE — Telephone Encounter (Signed)
Patient called and said when she woke up she was not feeling good and it is not gotten any better and she wanted someone to give her a call

## 2023-12-26 DIAGNOSIS — D509 Iron deficiency anemia, unspecified: Secondary | ICD-10-CM | POA: Insufficient documentation

## 2023-12-26 DIAGNOSIS — Z9884 Bariatric surgery status: Secondary | ICD-10-CM

## 2023-12-26 DIAGNOSIS — Z79899 Other long term (current) drug therapy: Secondary | ICD-10-CM | POA: Diagnosis not present

## 2023-12-26 DIAGNOSIS — D649 Anemia, unspecified: Secondary | ICD-10-CM

## 2023-12-26 NOTE — Progress Notes (Signed)
 [facility]  [phone] Fax:([phone]) [phone]  ID: [id number] UDO OB: 1975-12-14  MR#: [id number]  AOZ#:[id number]  Patient Care Team: Melchor Spoon, MD as PCP - General (Internal Medicine) Oh, Dora Gallo, MD (Inactive) as Referring Physician (Internal Medicine) Marquita Situ Magali Schmitz, MD as Consulting Physician (General Surgery)  REFERRING PROVIDER: Tery Fielding, NP  REASON FOR REFERRAL: Iron  deficiency anemia  HPI: Courtney Simpson is a 48 y.o. female with past medical history of anxiety, GERD, hyperlipidemia, hypertension, sarcoidosis previously on methotrexate referred to hematology for iron  deficiency anemia not responding to oral iron .  Patient having frequent syncopal episodes in the past 1 year. Seen by Dr. Braxton Calico from cardiology.  Had Holter monitor which showed short run of VT.  Echo was normal.    Diagnosed with iron  deficiency in August 2024.  Ferritin of 5 and hemoglobin of 9.7.  Started on Slow Fe 2 tabs a day.  Repeat labs done on 10/24 did not show any improvement.  So she was referred to hematology for IV iron . Denies any vaginal bleeding.  Occasional spotting.  Mirena  in place.  Gastric bypass surgery in 2017.  Colonoscopy with Dr. Emerick Hanlon on 11/11/2023 showed few diverticula, grade 1 internal hemorrhoids.  Upper endoscopy showed evidence of Roux-en-Y.  Completed IV Venofer  200 mg weekly x 5 doses in December 2024.  Interval history Patient was seen today as follow-up for iron  deficiency anemia, labs. Reports that with the initial infusion she was feeling more tired than usual but then towards the end it started feeling better.  Her energy is much better.  She is able to do more things.  Not feeling as cold.  In the past 3 weeks, she has been having issues with left elbow tendinitis.  She has reached out to her primary for consideration of cortisone shots.  REVIEW OF SYSTEMS:   ROS  As per HPI. Otherwise, a complete review of systems  is negative.  PAST MEDICAL HISTORY: Past Medical History:  Diagnosis Date   Anxiety    Arthritis    Depression    Fatty liver    GERD (gastroesophageal reflux disease)    Hyperlipidemia    Hypertension    Sarcoidosis    Sleep apnea     PAST SURGICAL HISTORY: Past Surgical History:  Procedure Laterality Date   ANTERIOR CRUCIATE LIGAMENT REPAIR Bilateral    BACK SURGERY     CERVICAL FUSION     CHOLECYSTECTOMY     COLONOSCOPY WITH PROPOFOL  N/A 02/23/2016   Procedure: COLONOSCOPY WITH PROPOFOL ;  Surgeon: Deveron Fly, MD;  Location: [facility] ENDOSCOPY;  Service: Endoscopy;  Laterality: N/A;   COLONOSCOPY WITH PROPOFOL  N/A 11/11/2023   Procedure: COLONOSCOPY WITH PROPOFOL ;  Surgeon: Shane Darling, MD;  Location: [facility] ENDOSCOPY;  Service: Endoscopy;  Laterality: N/A;   ESOPHAGOGASTRODUODENOSCOPY (EGD) WITH PROPOFOL  N/A 02/23/2016   Procedure: ESOPHAGOGASTRODUODENOSCOPY (EGD) WITH PROPOFOL ;  Surgeon: Deveron Fly, MD;  Location: [facility] ENDOSCOPY;  Service: Endoscopy;  Laterality: N/A;   ESOPHAGOGASTRODUODENOSCOPY (EGD) WITH PROPOFOL  N/A 11/11/2023   Procedure: ESOPHAGOGASTRODUODENOSCOPY (EGD) WITH PROPOFOL ;  Surgeon: Shane Darling, MD;  Location: [facility] ENDOSCOPY;  Service: Endoscopy;  Laterality: N/A;   GASTRIC BYPASS  03/2020    FAMILY HISTORY: Family History  Problem Relation Age of Onset   Cancer Paternal Grandmother        colon   Cancer Paternal Grandfather        colon   Breast cancer Neg Hx  HEALTH MAINTENANCE: Social History   Tobacco Use   Smoking status: Never   Smokeless tobacco: Never  Vaping Use   Vaping status: Every Day  Substance Use Topics   Alcohol use: Yes    Comment: occasionally   Drug use: No     Allergies  Allergen Reactions   Egg-Derived Products Rash    Egg whites   Other Anaphylaxis    Nuts   Misc. Sulfonamide Containing Compounds    Sulfamethoxazole    Trimethoprim    Adhesive [Tape] Dermatitis    Blisters    Septra [Sulfamethoxazole-Trimethoprim] Rash   Sulfa Antibiotics Rash   Vancomycin Rash    Current Outpatient Medications  Medication Sig Dispense Refill   folic acid  (FOLVITE ) 1 MG tablet Take 1 mg by mouth daily.     levonorgestrel  (MIRENA ) 20 MCG/24HR IUD 1 each by Intrauterine route once.     pantoprazole  (PROTONIX ) 40 MG tablet Take 40 mg by mouth daily.     No current facility-administered medications for this visit.    OBJECTIVE: Vitals:   12/26/23 0851  BP: (!) 133/90  Pulse: 71  Resp: 20  Temp: (!) 96.8 F (36 C)     Body mass index is 35.17 kg/m.      General: Well-developed, well-nourished, no acute distress. Eyes: Pink conjunctiva, anicteric sclera. HEENT: Normocephalic, moist mucous membranes, clear oropharnyx. Lungs: Clear to auscultation bilaterally. Heart: Regular rate and rhythm. No rubs, murmurs, or gallops. Abdomen: Soft, nontender, nondistended. No organomegaly noted, normoactive bowel sounds. Musculoskeletal: No edema, cyanosis, or clubbing. Neuro: Alert, answering all questions appropriately. Cranial nerves grossly intact. Skin: No rashes or petechiae noted. Psych: Normal affect. Lymphatics: No cervical, calvicular, axillary or inguinal LAD.   LAB RESULTS:  Lab Results  Component Value Date   NA 134 (L) 11/20/2023   K 3.3 (L) 11/20/2023   CL 104 11/20/2023   CO2 19 (L) 11/20/2023   GLUCOSE 79 11/20/2023   BUN 11 11/20/2023   CREATININE 0.81 11/20/2023   CALCIUM  8.6 (L) 11/20/2023   PROT 7.7 04/11/2022   ALBUMIN 3.8 04/11/2022   AST 26 04/11/2022   ALT 15 04/11/2022   ALKPHOS 116 04/11/2022   BILITOT 0.4 04/11/2022   GFRNONAA >60 11/20/2023   GFRAA >60 03/23/2016    Lab Results  Component Value Date   WBC 7.5 12/26/2023   NEUTROABS 4.8 12/26/2023   HGB 14.7 12/26/2023   HCT 44.4 12/26/2023   MCV 89.5 12/26/2023   PLT 294 12/26/2023    Lab Results  Component Value Date   TIBC 354 12/26/2023   FERRITIN 22 12/26/2023    FERRITIN 18 11/20/2015   IRONPCTSAT 28 12/26/2023     STUDIES: No results found.  ASSESSMENT AND PLAN:   [name] is a 48 y.o. female with pmh of anxiety, GERD, hyperlipidemia, hypertension, sarcoidosis previously on methotrexate referred to hematology for iron  deficiency anemia not responding to oral iron .  # Iron  deficiency anemia # History of gastric bypass surgery in 2017  -Present at least since August 2024.  On Slow Fe with no improvement in lab work.  Differentials include malabsorption from gastric bypass surgery  -Colonoscopy with Dr. Emerick Hanlon on 11/11/2023 showed few diverticula, grade 1 internal hemorrhoids.  Upper endoscopy showed evidence of Roux-en-Y. -Completed IV Venofer  200 mg weekly x 5 doses in December 2024. -Hemoglobin has improved from 9.6-14.  Ferritin 22. saturation 28%.  Will hold off on IV Venofer .  Follow-up in 6 months.  Discussed that she  will intermittently need iron  infusions probably lifelong with her history of gastric bypass surgery causing malabsorption. -Has occasional vaginal spotting.  Denies any vaginal bleeding.  Has Mirena  IUD in place. -Vitamin B12 level pending.  Will monitor with her history of the surgery.  # Syncopal episodes - Patient having frequent syncopal episodes in the past 1 year. Seen by Dr. Braxton Calico from cardiology.  Had Holter monitor which showed short run of VT.  Echo was normal.   -Has not had further episodes.  Continue follow-up with cardiology.  Orders Placed This Encounter  Procedures   CBC with Differential (Cancer Center Only)   Ferritin   Iron  and TIBC([facility])   RTC in 6 months for MD visit, labs, possible Venofer .  Patient expressed understanding and was in agreement with this plan. She also understands that She can call clinic at any time with any questions, concerns, or complaints.   I spent a total of 25 minutes reviewing chart data, face-to-face evaluation with the patient, counseling and  coordination of care as detailed above.  Loreatha Rodney, MD   12/26/2023 11:01 AM

## 2023-12-28 DIAGNOSIS — M542 Cervicalgia: Secondary | ICD-10-CM

## 2023-12-28 DIAGNOSIS — Z981 Arthrodesis status: Secondary | ICD-10-CM | POA: Diagnosis not present

## 2023-12-28 DIAGNOSIS — M5412 Radiculopathy, cervical region: Secondary | ICD-10-CM | POA: Diagnosis not present

## 2023-12-28 DIAGNOSIS — Z6837 Body mass index (BMI) 37.0-37.9, adult: Secondary | ICD-10-CM | POA: Diagnosis not present

## 2024-01-18 DIAGNOSIS — D860 Sarcoidosis of lung: Secondary | ICD-10-CM | POA: Diagnosis not present

## 2024-01-18 DIAGNOSIS — E663 Overweight: Secondary | ICD-10-CM | POA: Diagnosis not present

## 2024-01-18 DIAGNOSIS — G4733 Obstructive sleep apnea (adult) (pediatric): Secondary | ICD-10-CM | POA: Diagnosis not present

## 2024-01-18 DIAGNOSIS — D8683 Sarcoid iridocyclitis: Secondary | ICD-10-CM | POA: Diagnosis not present

## 2024-01-21 DIAGNOSIS — M5023 Other cervical disc displacement, cervicothoracic region: Secondary | ICD-10-CM | POA: Diagnosis not present

## 2024-01-21 DIAGNOSIS — M5412 Radiculopathy, cervical region: Secondary | ICD-10-CM

## 2024-01-21 DIAGNOSIS — M542 Cervicalgia: Secondary | ICD-10-CM

## 2024-01-21 DIAGNOSIS — Z981 Arthrodesis status: Secondary | ICD-10-CM

## 2024-03-15 DIAGNOSIS — R1011 Right upper quadrant pain: Secondary | ICD-10-CM

## 2024-03-15 DIAGNOSIS — R110 Nausea: Secondary | ICD-10-CM | POA: Diagnosis present

## 2024-03-19 DIAGNOSIS — R1013 Epigastric pain: Secondary | ICD-10-CM

## 2024-03-19 DIAGNOSIS — R1011 Right upper quadrant pain: Secondary | ICD-10-CM

## 2024-03-20 DIAGNOSIS — R1011 Right upper quadrant pain: Secondary | ICD-10-CM | POA: Insufficient documentation

## 2024-03-20 DIAGNOSIS — R1013 Epigastric pain: Secondary | ICD-10-CM | POA: Insufficient documentation
